# Patient Record
Sex: Female | Born: 1960 | Race: White | Hispanic: No | Marital: Single | Smoking: Never smoker
Health system: Southern US, Community
[De-identification: ages and names within clinical notes are randomized; demographics above are authoritative.]

## PROBLEM LIST (undated history)

## (undated) DIAGNOSIS — E669 Obesity, unspecified: Secondary | ICD-10-CM

## (undated) DIAGNOSIS — Z9109 Other allergy status, other than to drugs and biological substances: Secondary | ICD-10-CM

## (undated) DIAGNOSIS — T7840XA Allergy, unspecified, initial encounter: Secondary | ICD-10-CM

## (undated) DIAGNOSIS — M2550 Pain in unspecified joint: Secondary | ICD-10-CM

## (undated) DIAGNOSIS — I10 Essential (primary) hypertension: Secondary | ICD-10-CM

## (undated) HISTORY — DX: Pain in unspecified joint: M25.50

## (undated) HISTORY — DX: Obesity, unspecified: E66.9

## (undated) HISTORY — DX: Allergy, unspecified, initial encounter: T78.40XA

## (undated) HISTORY — PX: TONSILLECTOMY: SUR1361

## (undated) HISTORY — DX: Essential (primary) hypertension: I10

## (undated) HISTORY — DX: Other allergy status, other than to drugs and biological substances: Z91.09

---

## 2001-02-28 ENCOUNTER — Other Ambulatory Visit: Admission: RE | Admit: 2001-02-28 | Discharge: 2001-02-28 | Payer: Self-pay | Admitting: Internal Medicine

## 2002-05-09 ENCOUNTER — Other Ambulatory Visit: Admission: RE | Admit: 2002-05-09 | Discharge: 2002-05-09 | Payer: Self-pay | Admitting: Obstetrics and Gynecology

## 2003-05-21 ENCOUNTER — Other Ambulatory Visit: Admission: RE | Admit: 2003-05-21 | Discharge: 2003-05-21 | Payer: Self-pay | Admitting: Obstetrics and Gynecology

## 2004-05-21 ENCOUNTER — Other Ambulatory Visit: Admission: RE | Admit: 2004-05-21 | Discharge: 2004-05-21 | Payer: Self-pay | Admitting: Obstetrics and Gynecology

## 2005-05-26 ENCOUNTER — Other Ambulatory Visit: Admission: RE | Admit: 2005-05-26 | Discharge: 2005-05-26 | Payer: Self-pay | Admitting: Obstetrics and Gynecology

## 2011-06-14 ENCOUNTER — Ambulatory Visit (INDEPENDENT_AMBULATORY_CARE_PROVIDER_SITE_OTHER): Payer: 59

## 2011-06-14 DIAGNOSIS — E162 Hypoglycemia, unspecified: Secondary | ICD-10-CM

## 2011-06-14 DIAGNOSIS — I10 Essential (primary) hypertension: Secondary | ICD-10-CM

## 2012-03-30 ENCOUNTER — Ambulatory Visit (INDEPENDENT_AMBULATORY_CARE_PROVIDER_SITE_OTHER): Payer: 59 | Admitting: Family Medicine

## 2012-03-30 ENCOUNTER — Encounter: Payer: Self-pay | Admitting: Family Medicine

## 2012-03-30 VITALS — BP 160/94 | HR 97 | Temp 98.2°F | Ht 66.0 in | Wt 272.8 lb

## 2012-03-30 DIAGNOSIS — I10 Essential (primary) hypertension: Secondary | ICD-10-CM

## 2012-03-30 DIAGNOSIS — E669 Obesity, unspecified: Secondary | ICD-10-CM

## 2012-03-30 DIAGNOSIS — N390 Urinary tract infection, site not specified: Secondary | ICD-10-CM

## 2012-03-30 DIAGNOSIS — Z9109 Other allergy status, other than to drugs and biological substances: Secondary | ICD-10-CM

## 2012-03-30 LAB — CBC WITH DIFFERENTIAL/PLATELET
Basophils Absolute: 0 10*3/uL (ref 0.0–0.1)
Eosinophils Absolute: 0.2 10*3/uL (ref 0.0–0.7)
Lymphocytes Relative: 21.4 % (ref 12.0–46.0)
MCHC: 32.6 g/dL (ref 30.0–36.0)
MCV: 86.3 fl (ref 78.0–100.0)
Monocytes Absolute: 0.6 10*3/uL (ref 0.1–1.0)
Neutro Abs: 4.6 10*3/uL (ref 1.4–7.7)
Neutrophils Relative %: 65.7 % (ref 43.0–77.0)
RDW: 13.1 % (ref 11.5–14.6)

## 2012-03-30 LAB — POCT URINALYSIS DIPSTICK
Bilirubin, UA: NEGATIVE
Blood, UA: NEGATIVE
Ketones, UA: NEGATIVE
pH, UA: 6.5

## 2012-03-30 LAB — BASIC METABOLIC PANEL
CO2: 24 mEq/L (ref 19–32)
Calcium: 8.6 mg/dL (ref 8.4–10.5)
Chloride: 107 mEq/L (ref 96–112)
Creatinine, Ser: 0.9 mg/dL (ref 0.4–1.2)
Glucose, Bld: 98 mg/dL (ref 70–99)

## 2012-03-30 LAB — LIPID PANEL
HDL: 51.4 mg/dL (ref >39.00)
Triglycerides: 88 mg/dL (ref 0.0–149.0)
VLDL: 17.6 mg/dL (ref 0.0–40.0)

## 2012-03-30 LAB — HEPATIC FUNCTION PANEL
Albumin: 3.5 g/dL (ref 3.5–5.2)
Alkaline Phosphatase: 82 U/L (ref 39–117)
Bilirubin, Direct: 0.1 mg/dL (ref 0.0–0.3)
Total Protein: 7.1 g/dL (ref 6.0–8.3)

## 2012-03-30 MED ORDER — LOSARTAN POTASSIUM-HCTZ 50-12.5 MG PO TABS: 1.0000 | ORAL_TABLET | Freq: Every day | ORAL | Status: DC

## 2012-03-30 NOTE — Patient Instructions (Addendum)
Hypertension As your heart beats, it forces blood through your arteries. This force is your blood pressure. If the pressure is too high, it is called hypertension (HTN) or high blood pressure. HTN is dangerous because you may have it and not know it. High blood pressure may mean that your heart has to work harder to pump blood. Your arteries may be narrow or stiff. The extra work puts you at risk for heart disease, stroke, and other problems.  Blood pressure consists of two numbers, a higher number over a lower, 110/72, for example. It is stated as "110 over 72." The ideal is below 120 for the top number (systolic) and under 80 for the bottom (diastolic). Write down your blood pressure today. You should pay close attention to your blood pressure if you have certain conditions such as:  Heart failure.  Prior heart attack.  Diabetes  Chronic kidney disease.  Prior stroke.  Multiple risk factors for heart disease. To see if you have HTN, your blood pressure should be measured while you are seated with your arm held at the level of the heart. It should be measured at least twice. A one-time elevated blood pressure reading (especially in the Emergency Department) does not mean that you need treatment. There may be conditions in which the blood pressure is different between your right and left arms. It is important to see your caregiver soon for a recheck. Most people have essential hypertension which means that there is not a specific cause. This type of high blood pressure may be lowered by changing lifestyle factors such as:  Stress.  Smoking.  Lack of exercise.  Excessive weight.  Drug/tobacco/alcohol use.  Eating less salt. Most people do not have symptoms from high blood pressure until it has caused damage to the body. Effective treatment can often prevent, delay or reduce that damage. TREATMENT  When a cause has been identified, treatment for high blood pressure is directed at the  cause. There are a large number of medications to treat HTN. These fall into several categories, and your caregiver will help you select the medicines that are best for you. Medications may have side effects. You should review side effects with your caregiver. If your blood pressure stays high after you have made lifestyle changes or started on medicines,   Your medication(s) may need to be changed.  Other problems may need to be addressed.  Be certain you understand your prescriptions, and know how and when to take your medicine.  Be sure to follow up with your caregiver within the time frame advised (usually within two weeks) to have your blood pressure rechecked and to review your medications.  If you are taking more than one medicine to lower your blood pressure, make sure you know how and at what times they should be taken. Taking two medicines at the same time can result in blood pressure that is too low. SEEK IMMEDIATE MEDICAL CARE IF:  You develop a severe headache, blurred or changing vision, or confusion.  You have unusual weakness or numbness, or a faint feeling.  You have severe chest or abdominal pain, vomiting, or breathing problems. MAKE SURE YOU:   Understand these instructions.  Will watch your condition.  Will get help right away if you are not doing well or get worse. Document Released: 06/08/2005 Document Revised: 08/31/2011 Document Reviewed: 01/27/2008 [facility] Patient Information 2013 [place], [place].  Calorie Counting Diet A calorie counting diet requires you to eat the number of calories  that are right for you in a day. Calories are the measurement of how much energy you get from the food you eat. Eating the right amount of calories is important for staying at a healthy weight. If you eat too many calories, your body will store them as fat and you may gain weight. If you eat too few calories, you may lose weight. Counting the number of calories you eat during a  day will help you know if you are eating the right amount. A Registered Dietitian can determine how many calories you need in a day. The amount of calories needed varies from person to person. If your goal is to lose weight, you will need to eat fewer calories. Losing weight can benefit you if you are overweight or have health problems such as heart disease, high blood pressure, or diabetes. If your goal is to gain weight, you will need to eat more calories. Gaining weight may be necessary if you have a certain health problem that causes your body to need more energy. TIPS Whether you are increasing or decreasing the number of calories you eat during a day, it may be hard to get used to changes in what you eat and drink. The following are tips to help you keep track of the number of calories you eat.  Measure foods at home with measuring cups. This helps you know the amount of food and number of calories you are eating.  Restaurants often serve food in amounts that are larger than 1 serving. While eating out, estimate how many servings of a food you are given. For example, a serving of cooked rice is  cup or about the size of half of a fist. Knowing serving sizes will help you be aware of how much food you are eating at restaurants.  Ask for smaller portion sizes or child-size portions at restaurants.  Plan to eat half of a meal at a restaurant. Take the rest home or share the other half with a friend.  Read the Nutrition Facts panel on food labels for calorie content and serving size. You can find out how many servings are in a package, the size of a serving, and the number of calories each serving has.  For example, a package might contain 3 cookies. The Nutrition Facts panel on that package says that 1 serving is 1 cookie. Below that, it will say there are 3 servings in the container. The calories section of the Nutrition Facts label says there are 90 calories. This means there are 90 calories in  1 cookie (1 serving). If you eat 1 cookie you have eaten 90 calories. If you eat all 3 cookies, you have eaten 270 calories (3 servings x 90 calories = 270 calories). The list below tells you how big or small some common portion sizes are.  1 oz.........4 stacked dice.  3 oz........Marland KitchenDeck of cards.  1 tsp.......Marland KitchenTip of little finger.  1 tbs......Marland KitchenMarland KitchenThumb.  2 tbs.......Marland KitchenGolf ball.   cup......Marland KitchenHalf of a fist.  1 cup.......Marland KitchenA fist. KEEP A FOOD LOG Write down every food item you eat, the amount you eat, and the number of calories in each food you eat during the day. At the end of the day, you can add up the total number of calories you have eaten. It may help to keep a list like the one below. Find out the calorie information by reading the Nutrition Facts panel on food labels. Breakfast  Bran cereal (1 cup, 110 calories).  Fat-free milk ( cup, 45 calories). Snack  Apple (1 medium, 80 calories). Lunch  Spinach (1 cup, 20 calories).  Tomato ( medium, 20 calories).  Chicken breast strips (3 oz, 165 calories).  Shredded cheddar cheese ( cup, 110 calories).  Light [place] dressing (2 tbs, 60 calories).  Whole-wheat bread (1 slice, 80 calories).  Tub margarine (1 tsp, 35 calories).  Vegetable soup (1 cup, 160 calories). Dinner  Pork chop (3 oz, 190 calories).  Brown rice (1 cup, 215 calories).  Steamed broccoli ( cup, 20 calories).  Strawberries (1  cup, 65 calories).  Whipped cream (1 tbs, 50 calories). Daily Calorie Total: 1425 Document Released: 06/08/2005 Document Revised: 08/31/2011 Document Reviewed: 12/03/2006 [facility] Patient Information 2013 [place], [place].

## 2012-03-30 NOTE — Addendum Note (Signed)
Addended by: Silvio Pate D on: 03/30/2012 02:26 PM   Modules accepted: Orders

## 2012-03-30 NOTE — Progress Notes (Signed)
  Subjective:    Patient here for follow-up of elevated blood pressure.  She is not exercising and is not adherent to a low-salt diet.  Blood pressure is not checked  home. Cardiac symptoms: none. Patient denies: chest pain, chest pressure/discomfort, claudication, dyspnea, exertional chest pressure/discomfort, fatigue, irregular heart beat, lower extremity edema, near-syncope, orthopnea, palpitations, paroxysmal nocturnal dyspnea, syncope and tachypnea. Cardiovascular risk factors: hypertension, obesity (BMI >= 30 kg/m2) and sedentary lifestyle. Use of agents associated with hypertension: none. History of target organ damage: none.  The following portions of the patient's history were reviewed and updated as appropriate: allergies, current medications, past family history, past medical history, past social history, past surgical history and problem list.  Review of Systems Pertinent items are noted in HPI.     Objective:    BP 160/94  Pulse 97  Temp 98.2 F (36.8 C) (Oral)  Ht 5\' 6"  (1.676 m)  Wt 272 lb 12.8 oz (123.741 kg)  BMI 44.03 kg/m2  SpO2 95% General appearance: alert, cooperative, appears stated age and no distress Neck: no adenopathy, no carotid bruit, no JVD, supple, symmetrical, trachea midline and thyroid not enlarged, symmetric, no tenderness/mass/nodules Lungs: clear to auscultation bilaterally Heart: regular rate and rhythm, S1, S2 normal, no murmur, click, rub or gallop Extremities: extremities normal, atraumatic, no cyanosis or edema    Assessment:    Hypertension, stage 1 . Evidence of target organ damage: none.   obesity---d/w pt diet and exercise Plan:    Medication: no change and pt had not been on meds for a while because she ran out. Dietary sodium restriction. Regular aerobic exercise. Check blood pressures 2-3 times weekly and record. Follow up: 2 weeks and as needed.

## 2012-03-31 ENCOUNTER — Telehealth: Payer: Self-pay

## 2012-03-31 LAB — T4, FREE: Free T4: 0.76 ng/dL (ref 0.60–1.60)

## 2012-03-31 NOTE — Telephone Encounter (Signed)
Pt calling for test results.  All results given, except urine culture, which is not yet back.

## 2012-04-01 ENCOUNTER — Telehealth: Payer: Self-pay | Admitting: Family Medicine

## 2012-04-01 NOTE — Telephone Encounter (Signed)
Mailed pt results per request.         MW

## 2012-04-01 NOTE — Telephone Encounter (Signed)
Pt wants labe results mailed to her she doesn't have computer, pls mail

## 2012-04-02 LAB — URINE CULTURE: Colony Count: >=100000

## 2012-04-15 ENCOUNTER — Encounter: Payer: Self-pay | Admitting: Family Medicine

## 2012-04-15 ENCOUNTER — Ambulatory Visit (INDEPENDENT_AMBULATORY_CARE_PROVIDER_SITE_OTHER): Payer: 59 | Admitting: Family Medicine

## 2012-04-15 VITALS — BP 142/88 | HR 85 | Temp 98.6°F | Wt 275.0 lb

## 2012-04-15 DIAGNOSIS — I10 Essential (primary) hypertension: Secondary | ICD-10-CM

## 2012-04-15 MED ORDER — LOSARTAN POTASSIUM-HCTZ 100-25 MG PO TABS: 1.0000 | ORAL_TABLET | Freq: Every day | ORAL | Status: DC

## 2012-04-15 NOTE — Patient Instructions (Signed)

## 2012-04-15 NOTE — Progress Notes (Signed)
  Subjective:    Patient here for follow-up of elevated blood pressure.  She is not exercising and is adherent to a low-salt diet.  Blood pressure is not checked at home. Cardiac symptoms: none. Patient denies: chest pain, chest pressure/discomfort, claudication, dyspnea, exertional chest pressure/discomfort, fatigue, irregular heart beat, lower extremity edema, near-syncope, orthopnea, palpitations, paroxysmal nocturnal dyspnea, syncope and tachypnea. Cardiovascular risk factors: hypertension, obesity (BMI >= 30 kg/m2) and sedentary lifestyle. Use of agents associated with hypertension: none. History of target organ damage: none.  The following portions of the patient's history were reviewed and updated as appropriate: allergies, current medications, past family history, past medical history, past social history, past surgical history and problem list.  Review of Systems Pertinent items are noted in HPI.     Objective:    BP 142/88  Pulse 85  Temp 98.6 F (37 C) (Oral)  Wt 275 lb (124.739 kg)  SpO2 96% General appearance: alert, cooperative, appears stated age and no distress Lungs: clear to auscultation bilaterally Heart: S1, S2 normal Extremities: extremities normal, atraumatic, no cyanosis or edema    Assessment:    Hypertension, stage 1 . Evidence of target organ damage: none.    Plan:    Medication: increase to hyzaar 100/25 daily. Dietary sodium restriction. Regular aerobic exercise. Check blood pressures 2-3 times weekly and record. Follow up: 3 weeks and as needed.

## 2012-05-06 ENCOUNTER — Ambulatory Visit: Payer: 59 | Admitting: Family Medicine

## 2012-05-13 ENCOUNTER — Ambulatory Visit (INDEPENDENT_AMBULATORY_CARE_PROVIDER_SITE_OTHER): Payer: 59 | Admitting: Family Medicine

## 2012-05-13 ENCOUNTER — Encounter: Payer: Self-pay | Admitting: Family Medicine

## 2012-05-13 VITALS — BP 142/84 | HR 80 | Temp 98.2°F | Wt 270.0 lb

## 2012-05-13 DIAGNOSIS — I10 Essential (primary) hypertension: Secondary | ICD-10-CM

## 2012-05-13 MED ORDER — LOSARTAN POTASSIUM 100 MG PO TABS: 100.0000 mg | ORAL_TABLET | Freq: Every day | ORAL | Status: DC

## 2012-05-13 NOTE — Progress Notes (Signed)
  Subjective:    Patient here for follow-up of elevated blood pressure.  She is not exercising and is adherent to a low-salt diet.  Blood pressure not checked  at home. Cardiac symptoms: none. Patient denies: chest pain, chest pressure/discomfort, claudication, dyspnea, exertional chest pressure/discomfort, fatigue, irregular heart beat, lower extremity edema, near-syncope, orthopnea, palpitations, paroxysmal nocturnal dyspnea, syncope and tachypnea. Cardiovascular risk factors: hypertension, obesity (BMI >= 30 kg/m2) and sedentary lifestyle. Use of agents associated with hypertension: none. History of target organ damage: none.  The following portions of the patient's history were reviewed and updated as appropriate: allergies, current medications, past family history, past medical history, past social history, past surgical history and problem list.  Review of Systems Pertinent items are noted in HPI.     Objective:    BP 142/84  Pulse 80  Temp 98.2 F (36.8 C) (Oral)  Wt 270 lb (122.471 kg)  SpO2 97% General appearance: alert, cooperative, appears stated age and no distress Lungs: clear to auscultation bilaterally Heart: S1, S2 normal Extremities: extremities normal, atraumatic, no cyanosis or edema    Assessment:    Hypertension, stage 1 . Evidence of target organ damage: none.    Plan:    Medication: stop hyzaar and change to cozaar 100 mg. Dietary sodium restriction. Regular aerobic exercise. Check blood pressures 2-3 times weekly and record. Follow up: 2 weeks and as needed.

## 2012-05-13 NOTE — Patient Instructions (Signed)

## 2012-06-02 ENCOUNTER — Ambulatory Visit: Payer: 59 | Admitting: Family Medicine

## 2012-06-06 ENCOUNTER — Encounter: Payer: Self-pay | Admitting: Family Medicine

## 2012-06-06 ENCOUNTER — Ambulatory Visit (INDEPENDENT_AMBULATORY_CARE_PROVIDER_SITE_OTHER): Payer: 59 | Admitting: Family Medicine

## 2012-06-06 VITALS — BP 136/84 | HR 97 | Temp 97.6°F | Wt 269.4 lb

## 2012-06-06 DIAGNOSIS — I10 Essential (primary) hypertension: Secondary | ICD-10-CM

## 2012-06-06 MED ORDER — LOSARTAN POTASSIUM 100 MG PO TABS: ORAL_TABLET | ORAL | Status: DC

## 2012-06-06 NOTE — Patient Instructions (Addendum)

## 2012-06-06 NOTE — Progress Notes (Signed)
  Subjective:    Patient here for follow-up of elevated blood pressure.  She is not exercising and is adherent to a low-salt diet.  Blood pressure is well controlled at home. Cardiac symptoms: none. Patient denies: chest pain, chest pressure/discomfort, claudication, dyspnea, exertional chest pressure/discomfort, fatigue, irregular heart beat, lower extremity edema, near-syncope, orthopnea, palpitations, paroxysmal nocturnal dyspnea, syncope and tachypnea. Cardiovascular risk factors: hypertension, obesity (BMI >= 30 kg/m2) and sedentary lifestyle. Use of agents associated with hypertension: none. History of target organ damage: none.  The following portions of the patient's history were reviewed and updated as appropriate: allergies, current medications, past family history, past medical history, past social history, past surgical history and problem list.  Review of Systems Pertinent items are noted in HPI.     Objective:    BP 136/84  Pulse 97  Temp 97.6 F (36.4 C) (Oral)  Wt 269 lb 6.4 oz (122.199 kg)  SpO2 97% General appearance: alert, cooperative, appears stated age and no distress Neck: no adenopathy, supple, symmetrical, trachea midline and thyroid not enlarged, symmetric, no tenderness/mass/nodules Lungs: clear to auscultation bilaterally Heart: S1, S2 normal Extremities: extremities normal, atraumatic, no cyanosis or edema    Assessment:    Hypertension, normal blood pressure . Evidence of target organ damage: none.    Plan:    Medication: no change. Dietary sodium restriction. Regular aerobic exercise. Check blood pressures 2-3 times weekly and record. Follow up: 6 months and as needed.

## 2012-12-05 ENCOUNTER — Ambulatory Visit (INDEPENDENT_AMBULATORY_CARE_PROVIDER_SITE_OTHER): Payer: 59 | Admitting: Family Medicine

## 2012-12-05 ENCOUNTER — Encounter: Payer: Self-pay | Admitting: Family Medicine

## 2012-12-05 VITALS — BP 124/76 | HR 84 | Temp 99.5°F | Wt 271.0 lb

## 2012-12-05 DIAGNOSIS — J309 Allergic rhinitis, unspecified: Secondary | ICD-10-CM

## 2012-12-05 DIAGNOSIS — J209 Acute bronchitis, unspecified: Secondary | ICD-10-CM

## 2012-12-05 MED ORDER — HYDROCODONE-HOMATROPINE 5-1.5 MG/5ML PO SYRP: ORAL_SOLUTION | ORAL | Status: DC

## 2012-12-05 MED ORDER — AZITHROMYCIN 250 MG PO TABS: ORAL_TABLET | ORAL | Status: DC

## 2012-12-05 MED ORDER — MOMETASONE FUROATE 50 MCG/ACT NA SUSP: 2.0000 | Freq: Every day | NASAL | Status: DC

## 2012-12-05 NOTE — Progress Notes (Signed)
  Subjective:     Kirsten Nelson is a 52 y.o. female here for evaluation of a cough. Onset of symptoms was 4 days ago. Symptoms have been gradually worsening since that time. The cough is productive of green sputum and is aggravated by infection, pollens and reclining position. Associated symptoms include: change in voice, postnasal drip, shortness of breath and sputum production. Patient does not have a history of asthma. Patient does have a history of environmental allergens. Patient has not traveled recently. Patient does not have a history of smoking. Patient has not had a previous chest x-ray. Patient has not had a PPD done.  The following portions of the patient's history were reviewed and updated as appropriate: allergies, current medications, past family history, past medical history, past social history, past surgical history and problem list.  Review of Systems Pertinent items are noted in HPI.    Objective:    Oxygen saturation 96% on room air BP 124/76  Pulse 84  Temp(Src) 99.5 F (37.5 C) (Oral)  Wt 271 lb (122.925 kg)  BMI 43.76 kg/m2  SpO2 96% General appearance: alert, cooperative, appears stated age and no distress Ears: normal TM's and external ear canals both ears Nose: green discharge, mild congestion, no sinus tenderness Throat: lips, mucosa, and tongue normal; teeth and gums normal Neck: moderate anterior cervical adenopathy, supple, symmetrical, trachea midline and thyroid not enlarged, symmetric, no tenderness/mass/nodules Lungs: rhonchi bilaterally Heart: S1, S2 normal    Assessment:    Acute Bronchitis    Plan:    Antibiotics per medication orders. Antitussives per medication orders. Avoid exposure to tobacco smoke and fumes. Call if shortness of breath worsens, blood in sputum, change in character of cough, development of fever or chills, inability to maintain nutrition and hydration. Avoid exposure to tobacco smoke and fumes. Trial of  antihistamines. Trial of steroid nasal spray.

## 2012-12-05 NOTE — Patient Instructions (Signed)

## 2012-12-07 ENCOUNTER — Telehealth: Payer: Self-pay | Admitting: Family Medicine

## 2012-12-07 NOTE — Telephone Encounter (Signed)
Patient is calling back about note. Wants to know if she can receive a call back today.

## 2012-12-07 NOTE — Telephone Encounter (Signed)
Patient aware note ready for pick up.      KP 

## 2012-12-07 NOTE — Telephone Encounter (Signed)
Patient called requesting that we write her a new work note and change the return to work date from 6/18 to 6/23. She states she does not think she can go back to work right now. Call [phone]

## 2012-12-07 NOTE — Telephone Encounter (Signed)
Ok to give note 

## 2013-02-27 ENCOUNTER — Telehealth: Payer: Self-pay | Admitting: General Practice

## 2013-02-27 MED ORDER — AMLODIPINE BESYLATE 5 MG PO TABS: 5.0000 mg | ORAL_TABLET | Freq: Every day | ORAL | Status: DC

## 2013-02-27 NOTE — Telephone Encounter (Signed)
Called both numbers listed. LMOVM for pt to return call on both.

## 2013-02-27 NOTE — Telephone Encounter (Signed)
Spoke with pt and notified her of the med change. OV made on 9/22 for follow up.

## 2013-02-27 NOTE — Telephone Encounter (Signed)
Cozaar can cause cough.  It is less likely than ace inhibitors but can.   We can change to norvasc 5 mg 1 po qd #30  ---ov in 2 weeks

## 2013-02-27 NOTE — Telephone Encounter (Signed)
Patient states that she has a dry cough that has not went away that she believed is cause by her BP medication. She does not recall how long.  She asked her pharmacist if anyone else has mentioned the BP medication causing this and was told that according to studies 3-11% of people have this symptom. Patient says besides the cough she has not had any issues with her BP medication.

## 2013-02-27 NOTE — Telephone Encounter (Signed)
Please advise if you feel that the Bp meds could be causing the cough? I can schedule pt for an appt.

## 2013-02-27 NOTE — Telephone Encounter (Signed)
Message on triage line: Pt states she is having an issue with her BP meds. Called pt back and LMOVM to verify what type of problems she is having? And for how long?

## 2013-03-13 ENCOUNTER — Ambulatory Visit (INDEPENDENT_AMBULATORY_CARE_PROVIDER_SITE_OTHER): Payer: 59 | Admitting: Family Medicine

## 2013-03-13 ENCOUNTER — Encounter: Payer: Self-pay | Admitting: Family Medicine

## 2013-03-13 VITALS — BP 142/84 | HR 107 | Temp 98.1°F | Wt 273.0 lb

## 2013-03-13 DIAGNOSIS — I10 Essential (primary) hypertension: Secondary | ICD-10-CM

## 2013-03-13 MED ORDER — AMLODIPINE BESYLATE 10 MG PO TABS: 10.0000 mg | ORAL_TABLET | Freq: Every day | ORAL | Status: DC

## 2013-03-13 NOTE — Patient Instructions (Signed)

## 2013-03-13 NOTE — Progress Notes (Signed)
  Subjective:    Patient here for follow-up of elevated blood pressure.  She is not exercising and is adherent to a low-salt diet.  Blood pressure is not well controlled at home. Cardiac symptoms: fatigue. Patient denies: chest pain, chest pressure/discomfort, claudication, dyspnea, exertional chest pressure/discomfort, irregular heart beat, lower extremity edema, near-syncope, orthopnea, palpitations, paroxysmal nocturnal dyspnea, syncope and tachypnea. Cardiovascular risk factors: hypertension, obesity (BMI >= 30 kg/m2) and sedentary lifestyle. Use of agents associated with hypertension: none. History of target organ damage: none.  The following portions of the patient's history were reviewed and updated as appropriate:  She  has a past medical history of HTN (hypertension) and Environmental allergies. She  does not have any pertinent problems on file. She  has past surgical history that includes Tonsillectomy. Her family history includes Hypertension in her father and mother; Leukemia in her father; Lymphoma in her father. She  reports that she has never smoked. She has never used smokeless tobacco. She reports that she does not drink alcohol or use illicit drugs. She has a current medication list which includes the following prescription(s): amlodipine. Current Outpatient Prescriptions on File Prior to Visit  Medication Sig Dispense Refill  . amLODipine (NORVASC) 5 MG tablet Take 1 tablet (5 mg total) by mouth daily.  30 tablet  0   No current facility-administered medications on file prior to visit.   She is allergic to ace inhibitors..  Review of Systems Pertinent items are noted in HPI.     Objective:    BP 142/84  Pulse 107  Temp(Src) 98.1 F (36.7 C) (Oral)  Wt 273 lb (123.832 kg)  BMI 44.08 kg/m2  SpO2 97% General appearance: alert, cooperative, appears stated age and no distress Neck: no adenopathy, no carotid bruit, no JVD, supple, symmetrical, trachea midline and thyroid  not enlarged, symmetric, no tenderness/mass/nodules Lungs: clear to auscultation bilaterally Heart: S1, S2 normal Extremities: extremities normal, atraumatic, no cyanosis or edema    Assessment:    Hypertension, stage 1 . Evidence of target organ damage: none.    Plan:    Medication: increase to norvasc 10 mg. Dietary sodium restriction. Regular aerobic exercise. Check blood pressures 2-3 times weekly and record. Follow up: 3 months and as needed.

## 2013-03-21 ENCOUNTER — Telehealth: Payer: Self-pay | Admitting: Family Medicine

## 2013-03-21 NOTE — Telephone Encounter (Signed)
Patient called and spoke with Mrs Kirsten Nelson up front and stated that her BP med's were doubled by dr Laury Axon causing her problems now. Patient wanted to know can she get them switched again. Thanks

## 2013-03-21 NOTE — Telephone Encounter (Signed)
If its swelling we may need to switch all together----

## 2013-03-21 NOTE — Telephone Encounter (Signed)
Norvasc was increase from 5 mg to 10 mg on 03/13/13. I have tried to call the patient to get details. MSG left to call on VM.     KP

## 2013-03-21 NOTE — Telephone Encounter (Signed)
Need more info

## 2013-03-22 ENCOUNTER — Telehealth: Payer: Self-pay | Admitting: Family Medicine

## 2013-03-22 MED ORDER — METOPROLOL SUCCINATE ER 50 MG PO TB24: 50.0000 mg | ORAL_TABLET | Freq: Every day | ORAL | Status: DC

## 2013-03-22 NOTE — Telephone Encounter (Signed)
Please advise      KP 

## 2013-03-22 NOTE — Telephone Encounter (Signed)
Discussed with patient and she voiced understanding. Rx sent, she will call back to schedule the follow up.     KP

## 2013-03-22 NOTE — Telephone Encounter (Signed)
Patient called to return Kirsten Nelson's phone call and stated that her BP medicine is causing swelling and stomach pain.

## 2013-03-22 NOTE — Telephone Encounter (Signed)
D/c norvasc and change to toprol xl 50 mg 1 po qd  #30  ---- f/u 2-3 weeks

## 2013-06-30 ENCOUNTER — Encounter: Payer: Self-pay | Admitting: Internal Medicine

## 2013-06-30 ENCOUNTER — Ambulatory Visit (INDEPENDENT_AMBULATORY_CARE_PROVIDER_SITE_OTHER): Payer: 59 | Admitting: Internal Medicine

## 2013-06-30 VITALS — BP 165/92 | HR 110 | Temp 99.1°F | Wt 274.0 lb

## 2013-06-30 DIAGNOSIS — J209 Acute bronchitis, unspecified: Secondary | ICD-10-CM

## 2013-06-30 DIAGNOSIS — J069 Acute upper respiratory infection, unspecified: Secondary | ICD-10-CM

## 2013-06-30 DIAGNOSIS — I10 Essential (primary) hypertension: Secondary | ICD-10-CM

## 2013-06-30 MED ORDER — HYDROCODONE-HOMATROPINE 5-1.5 MG/5ML PO SYRP: ORAL_SOLUTION | ORAL | Status: DC

## 2013-06-30 MED ORDER — AMOXICILLIN 500 MG PO CAPS: 1000.0000 mg | ORAL_CAPSULE | Freq: Two times a day (BID) | ORAL | Status: DC

## 2013-06-30 NOTE — Progress Notes (Signed)
   Subjective:    Patient ID: Kirsten Nelson, female    DOB: [birth year], 53 y.o.   MRN: [id number]  HPI Acute visit Symptoms started ~ 8 days ago with sore throat, runny nose; this week she developed a persistent cough which is quite bothersome (has  bladder issues). She also developed nasal congestion with green nasal discharge. Taking Coricidin BP OTC and hydrocodone which was previously prescribed for nocturnal cough. BP was noted to be elevated, has not check it  lately. Good compliance with medication.  Past Medical History  Diagnosis Date  . HTN (hypertension)   . Environmental allergies    Past Surgical History  Procedure Laterality Date  . Tonsillectomy      Review of Systems No fever chills No myalgias or arthralgias. No nausea or diarrhea but vomited one time due to severe cough     Objective:   Physical Exam BP 165/92  Pulse 110  Temp(Src) 99.1 F (37.3 C)  Wt 274 lb (124.286 kg)  SpO2 93% General -- alert, well-developed, NAD.  HEENT-- Not pale. TMs normal, throat symmetric, no redness or discharge. Face symmetric, sinuses not tender to palpation. Nose slt congested. Lungs -- normal respiratory effort, no intercostal retractions, no accessory muscle use, and normal breath sounds.  Heart-- normal rate, regular rhythm, no murmur.   Neurologic--  alert & oriented X3. Speech normal, gait normal, strength normal in all extremities.  Psych-- Cognition and judgment appear intact. Cooperative with normal attention span and concentration. No anxious or depressed appearing.     Assessment & Plan:  URI-- see instructions HTN--BP elevated today, will ask patient to monitor her BP and notify PCP if not better. See instructions

## 2013-06-30 NOTE — Progress Notes (Signed)
Pre visit review using our clinic review tool, if applicable. No additional management support is needed unless otherwise documented below in the visit note. 

## 2013-06-30 NOTE — Patient Instructions (Signed)
Rest, fluids , tylenol For cough, take Mucinex DM twice a day as needed  For congestion use OTC Nasocort: 2 nasal sprays on each side of the nose daily until you feel better For nocturnal cough hydrocodone as needed, will cause somnolence Take the antibiotic as prescribed  (Amoxicillin) only if no better in 3-4 days Call if no better in 10 days Call anytime if the symptoms are severe  Check the  blood pressure 2  times a   week be sure it is between 110/60 and 140/85. Ideal blood pressure is 120/80. If it is consistently higher or lower, let Dr Catha Nottingham know

## 2013-07-02 ENCOUNTER — Encounter: Payer: Self-pay | Admitting: Internal Medicine

## 2013-08-04 ENCOUNTER — Encounter: Payer: Self-pay | Admitting: Family Medicine

## 2013-08-04 ENCOUNTER — Encounter: Payer: Self-pay | Admitting: Gastroenterology

## 2013-08-04 ENCOUNTER — Ambulatory Visit (INDEPENDENT_AMBULATORY_CARE_PROVIDER_SITE_OTHER): Payer: 59 | Admitting: Family Medicine

## 2013-08-04 VITALS — BP 120/70 | HR 74 | Temp 97.6°F | Wt 276.0 lb

## 2013-08-04 DIAGNOSIS — Z0000 Encounter for general adult medical examination without abnormal findings: Secondary | ICD-10-CM

## 2013-08-04 DIAGNOSIS — I10 Essential (primary) hypertension: Secondary | ICD-10-CM

## 2013-08-04 MED ORDER — METOPROLOL SUCCINATE ER 50 MG PO TB24: 50.0000 mg | ORAL_TABLET | Freq: Every day | ORAL | Status: DC

## 2013-08-04 NOTE — Progress Notes (Signed)
  Subjective:    Patient here for follow-up of elevated blood pressure.  She is not exercising and is adherent to a low-salt diet.  Blood pressure is well controlled at home. Cardiac symptoms: none. Patient denies: chest pain, chest pressure/discomfort, claudication, dyspnea, exertional chest pressure/discomfort, fatigue, irregular heart beat, lower extremity edema, near-syncope, orthopnea, palpitations, paroxysmal nocturnal dyspnea, syncope and tachypnea. Cardiovascular risk factors: hypertension, obesity (BMI >= 30 kg/m2) and sedentary lifestyle. Use of agents associated with hypertension: none. History of target organ damage: none.  The following portions of the patient's history were reviewed and updated as appropriate: allergies, current medications, past family history, past medical history, past social history, past surgical history and problem list.  Review of Systems Pertinent items are noted in HPI.     Objective:    BP 120/70  Pulse 74  Temp(Src) 97.6 F (36.4 C) (Oral)  Wt 276 lb (125.193 kg)  SpO2 95% General appearance: alert, cooperative, appears stated age and no distress Neck: no adenopathy, supple, symmetrical, trachea midline and thyroid not enlarged, symmetric, no tenderness/mass/nodules Lungs: clear to auscultation bilaterally Heart: S1, S2 normal Extremities: extremities normal, atraumatic, no cyanosis or edema    Assessment:    Hypertension, normal blood pressure . Evidence of target organ damage: none.    Plan:    Medication: no change. Dietary sodium restriction. Regular aerobic exercise. Follow up: 6 months and as needed.

## 2013-08-04 NOTE — Patient Instructions (Signed)

## 2013-08-04 NOTE — Progress Notes (Signed)
Pre visit review using our clinic review tool, if applicable. No additional management support is needed unless otherwise documented below in the visit note. 

## 2013-08-07 ENCOUNTER — Telehealth: Payer: Self-pay | Admitting: Family Medicine

## 2013-08-07 NOTE — Telephone Encounter (Signed)
Relevant patient education assigned to patient using Emmi. ° °

## 2013-08-14 ENCOUNTER — Encounter: Payer: Self-pay | Admitting: Gastroenterology

## 2013-09-13 ENCOUNTER — Encounter: Payer: 59 | Admitting: Gastroenterology

## 2013-09-25 ENCOUNTER — Ambulatory Visit (AMBULATORY_SURGERY_CENTER): Payer: Self-pay | Admitting: *Deleted

## 2013-09-25 VITALS — Ht 66.0 in | Wt 280.0 lb

## 2013-09-25 DIAGNOSIS — Z1211 Encounter for screening for malignant neoplasm of colon: Secondary | ICD-10-CM

## 2013-09-25 MED ORDER — MOVIPREP 100 G PO SOLR
ORAL | Status: DC
Start: 2013-09-25 — End: 2013-10-06

## 2013-09-25 NOTE — Progress Notes (Signed)
No egg or soy allergy  No home oxygen use or problems with anesthesia

## 2013-10-02 ENCOUNTER — Encounter: Payer: Self-pay | Admitting: Gastroenterology

## 2013-10-06 ENCOUNTER — Ambulatory Visit (AMBULATORY_SURGERY_CENTER): Payer: 59 | Admitting: Gastroenterology

## 2013-10-06 ENCOUNTER — Encounter: Payer: Self-pay | Admitting: Gastroenterology

## 2013-10-06 VITALS — BP 122/74 | HR 65 | Temp 98.1°F | Resp 22 | Ht 66.0 in | Wt 280.0 lb

## 2013-10-06 DIAGNOSIS — D126 Benign neoplasm of colon, unspecified: Secondary | ICD-10-CM

## 2013-10-06 DIAGNOSIS — Z1211 Encounter for screening for malignant neoplasm of colon: Secondary | ICD-10-CM

## 2013-10-06 MED ORDER — SODIUM CHLORIDE 0.9 % IV SOLN: 500.0000 mL | INTRAVENOUS | Status: DC

## 2013-10-06 NOTE — Progress Notes (Signed)
Procedure ends, to recovery, report given and VSS. 

## 2013-10-06 NOTE — Op Note (Signed)
[facility]  [address] [place], [address]   COLONOSCOPY PROCEDURE REPORT  PATIENT: Kirsten, Nelson  MR#: [id number] BIRTHDATE: 10-29-1960 , 82  yrs. old GENDER: Female ENDOSCOPIST: Milus Banister, MD REFERRED OZ:HYQMVH [place], DO PROCEDURE DATE:  10/06/2013 PROCEDURE:   Colonoscopy with snare polypectomy First Screening Colonoscopy - Avg.  risk and is 50 yrs.  old or older Yes.  Prior Negative Screening - Now for repeat screening. N/A  History of Adenoma - Now for follow-up colonoscopy & has been > or = to 3 yrs.  N/A  Polyps Removed Today? Yes. ASA CLASS:   Class II INDICATIONS:average risk screening. MEDICATIONS: MAC sedation, administered by CRNA and propofol (Diprivan) 250mg  IV  DESCRIPTION OF PROCEDURE:   After the risks benefits and alternatives of the procedure were thoroughly explained, informed consent was obtained.  A digital rectal exam revealed no abnormalities of the rectum.   The LB [id number]  endoscope was introduced through the anus and advanced to the cecum, which was identified by both the appendix and ileocecal valve. No adverse events experienced.   The quality of the prep was excellent.  The instrument was then slowly withdrawn as the colon was fully examined.   COLON FINDINGS: One polyp was found, removed and sent to pathology. This was sessile, located in cecum, 37mm across, removed with cold snare.  The examination was otherwise normal.  Retroflexed views revealed no abnormalities. The time to cecum=3 minutes 08 seconds. Withdrawal time=5 minutes 53 seconds.  The scope was withdrawn and the procedure completed. COMPLICATIONS: There were no complications.  ENDOSCOPIC IMPRESSION: One polyp was found, removed and sent to pathology. The examination was otherwise normal.  RECOMMENDATIONS: If the polyp(s) removed today are proven to be adenomatous (pre-cancerous) polyps, you will need a repeat colonoscopy in 5 years.   Otherwise you should continue to follow colorectal cancer screening guidelines for "routine risk" patients with colonoscopy in 10 years.  You will receive a letter within 1-2 weeks with the results of your biopsy as well as final recommendations.  Please call my office if you have not received a letter after 3 weeks.   eSigned:  Milus Banister, MD 10/06/2013 10:10 AM

## 2013-10-06 NOTE — Patient Instructions (Signed)

## 2013-10-06 NOTE — Progress Notes (Signed)
Called to room to assist during endoscopic procedure.  Patient ID and intended procedure confirmed with present staff. Received instructions for my participation in the procedure from the performing physician.  

## 2013-10-09 ENCOUNTER — Telehealth: Payer: Self-pay

## 2013-10-09 NOTE — Telephone Encounter (Signed)
  Follow up Call-  Call back number 10/06/2013  Post procedure Call Back phone  # [phone]  Permission to leave phone message Yes     Patient questions:  Do you have a fever, pain , or abdominal swelling? no Pain Score  0 *  Have you tolerated food without any problems? yes  Have you been able to return to your normal activities? yes  Do you have any questions about your discharge instructions: Diet   no Medications  no Follow up visit  no  Do you have questions or concerns about your Care? no  Actions: * If pain score is 4 or above: No action needed, pain <4.  No problems per the pt. Maw

## 2013-10-11 ENCOUNTER — Encounter: Payer: Self-pay | Admitting: Gastroenterology

## 2014-07-10 ENCOUNTER — Ambulatory Visit (INDEPENDENT_AMBULATORY_CARE_PROVIDER_SITE_OTHER): Payer: 59 | Admitting: Internal Medicine

## 2014-07-10 ENCOUNTER — Encounter: Payer: Self-pay | Admitting: Internal Medicine

## 2014-07-10 VITALS — BP 140/90 | HR 65 | Temp 98.0°F | Ht 66.0 in | Wt 264.2 lb

## 2014-07-10 DIAGNOSIS — I8393 Asymptomatic varicose veins of bilateral lower extremities: Secondary | ICD-10-CM

## 2014-07-10 DIAGNOSIS — L309 Dermatitis, unspecified: Secondary | ICD-10-CM

## 2014-07-10 NOTE — Progress Notes (Signed)
   Subjective:    Patient ID: Kirsten Nelson, female    DOB: 1961/03/29, 54 y.o.   MRN: [id number]  HPI  Her exercise trainer noted a rash in the back of her legs which was asymptomatic 07/03/14  This was manifested as tiny bumps with a roughness of the skin. There was no associated itching until she began to use topical agents  She's been using anti-itch cream as well as hydrocortisone.   She bathes with a Tone liquid soap.  Review of Systems  No associated itchy, watery eyes.  Swelling of the lips or tongue denied.  Shortness of breath, wheezing, or cough absent.  Fever ,chills , or sweats denied. Purulence absent.  Diarrhea not present.    Objective:   Physical Exam Positive or pertinent findings include: She has severe varicose veins of the lower extremities, greater in the right lower extremity than the left. She has diffuse patchy eczematoid areas which are rough and faintly erythematous. There is no blanching to pressure There is no evidence of stasis dermatitis over the shins.  General appearance :adequately nourished; in no distress. As per CDC Guidelines ,Epic documents obesity as being present . Eyes: No conjunctival inflammation or scleral icterus is present. Oral exam: Dental hygiene is good. Lips and gums are healthy appearing.There is no oropharyngeal erythema or exudate noted.  Heart:  Normal rate and regular rhythm. S1 and S2 normal without gallop, murmur, click, rub or other extra sounds   Lungs:Chest clear to auscultation; no wheezes, rhonchi,rales ,or rubs present.No increased work of breathing.  Abdomen: bowel sounds normal, soft and non-tender without masses, organomegaly or hernias noted.  No guarding or rebound.  Vascular : all pulses equal ; no bruits present. Skin:Warm & dry. no jaundice or tenting Lymphatic: No lymphadenopathy is noted about the head, neck, axilla           Assessment & Plan:  #1 eczematoid dermatitis most likely related to  hot showers and ambient temperature/dry heat  Plan: See after visit summary Xyzal qhs for itching

## 2014-07-10 NOTE — Progress Notes (Signed)
Pre visit review using our clinic review tool, if applicable. No additional management support is needed unless otherwise documented below in the visit note. 

## 2014-07-10 NOTE — Patient Instructions (Addendum)
Use  Aveeno Daily  Moisturizing Lotion  twice a day  for the dry skin. Continue to bathe with moisturizing liquid soap , not bar soap.  Please call Dr Etter Sjogren if you would like to be referred to evaluate the varicose veins.

## 2014-07-11 DIAGNOSIS — I8390 Asymptomatic varicose veins of unspecified lower extremity: Secondary | ICD-10-CM | POA: Insufficient documentation

## 2014-07-22 ENCOUNTER — Ambulatory Visit (INDEPENDENT_AMBULATORY_CARE_PROVIDER_SITE_OTHER): Payer: 59 | Admitting: Internal Medicine

## 2014-07-22 VITALS — BP 151/98 | HR 74 | Temp 98.0°F | Resp 18 | Ht 66.0 in | Wt 266.0 lb

## 2014-07-22 DIAGNOSIS — L259 Unspecified contact dermatitis, unspecified cause: Secondary | ICD-10-CM

## 2014-07-22 DIAGNOSIS — T7840XA Allergy, unspecified, initial encounter: Secondary | ICD-10-CM

## 2014-07-22 MED ORDER — FLUOCINONIDE 0.05 % EX CREA: 1.0000 "application " | TOPICAL_CREAM | Freq: Two times a day (BID) | CUTANEOUS | Status: DC

## 2014-07-22 MED ORDER — PREDNISONE 20 MG PO TABS: ORAL_TABLET | ORAL | Status: DC

## 2014-07-22 NOTE — Progress Notes (Signed)
Subjective:  This chart was scribed for Kirsten Lin, MD by Dellis Filbert, ED Scribe at [facility].The patient was seen in exam room 07 and the patient's care was started at 11:01 AM.   Patient ID: Kirsten Nelson, female    DOB: 1960/12/22, 54 y.o.   MRN: [id number] Chief Complaint  Patient presents with  . Rash    ?allergic reaction to lotion   HPI HPI Comments: Kirsten Nelson is a 54 y.o. female with a history of hypertension who presents to [facility] complaining of an allergic reaction to a body lotion that she used yesterday morning. She has developed a rash which has progressively worsened since onset. The rash is located on her chest, arms and neck.  Pt has itching as associated symptoms. Pt took benadryl last night for little relief. Pt has not spent much time in the sun.  Patient Active Problem List   Diagnosis Date Noted  . 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 HTN (hypertension) 03/30/2012  . Environmental allergies 03/30/2012   Past Medical History  Diagnosis Date  . HTN (hypertension)   . Environmental allergies   . Allergy    Past Surgical History  Procedure Laterality Date  . Tonsillectomy     Allergies  Allergen Reactions  . Ace Inhibitors Cough   Prior to Admission medications   Medication Sig Start Date End Date Taking? Authorizing Provider  levocetirizine (XYZAL) 5 MG tablet Take 5 mg by mouth every evening.   Yes Historical Provider, MD  metoprolol succinate (TOPROL-XL) 50 MG 24 hr tablet Take 1 tablet (50 mg total) by mouth daily. Take with or immediately following a meal. 08/04/13  Yes Yvonne R Lowne, DO  mometasone (NASONEX) 50 MCG/ACT nasal spray Place 2 sprays into the nose as needed.   Yes Historical Provider, MD  Montelukast Sodium (SINGULAIR PO) Take by mouth daily.   Yes Historical Provider, MD   Review of Systems  Skin: Positive for color change and rash.       Objective:  BP  151/98 mmHg  Pulse 74  Temp(Src) 98 F (36.7 C) (Oral)  Resp 18  Ht 5\' 6"  (1.676 m)  Wt 266 lb (120.657 kg)  BMI 42.95 kg/m2  SpO2 94%  Physical Exam  Constitutional: She is oriented to person, place, and time. She appears well-developed and well-nourished. No distress.  HENT:  Head: Normocephalic and atraumatic.  Eyes: Conjunctivae are normal. Pupils are equal, round, and reactive to light.  Neck: Normal range of motion.  Cardiovascular: Normal rate and regular rhythm.   Pulmonary/Chest: Effort normal. No respiratory distress.  Musculoskeletal: Normal range of motion.  Neurological: She is alert and oriented to person, place, and time.  Skin: Skin is warm and dry.  Has slightly raised confluent erythematous rash over chest and arms with splotchy redness on the neck no vesicles no petechiae.   Psychiatric: She has a normal mood and affect. Her behavior is normal.  Nursing note and vitals reviewed.      Assessment & Plan:  Allergic reaction, initial encounter  Contact dermatitis  Meds ordered this encounter  Medications  . predniSONE (DELTASONE) 20 MG tablet    Sig: Take 3 tablets a day, 2 tablets tomorrow, and 1 on Tuesday.    Dispense:  6 tablet    Refill:  0  . fluocinonide cream (LIDEX) 0.05 %    Sig: Apply 1 application topically 2 (two) times daily.  Dispense:  30 g    Refill:  0  discontinue irritant Continue xyzal lubricant   I have completed the patient encounter in its entirety as documented by the scribe, with editing by me where necessary. Benjamin Casanas P. Laney Pastor, M.D.

## 2014-08-22 ENCOUNTER — Telehealth: Payer: Self-pay | Admitting: Family Medicine

## 2014-08-22 DIAGNOSIS — I10 Essential (primary) hypertension: Secondary | ICD-10-CM

## 2014-08-22 MED ORDER — METOPROLOL SUCCINATE ER 50 MG PO TB24: 50.0000 mg | ORAL_TABLET | Freq: Every day | ORAL | Status: DC

## 2014-08-22 NOTE — Telephone Encounter (Signed)
Rx faxed.    KP 

## 2014-08-22 NOTE — Telephone Encounter (Signed)
Caller name: Shannette, Tabares Relation to pt: self  Call back number: [phone] Pharmacy: [id number] - [place], [place] [phone] (Phone) [phone] (Fax)         Reason for call:  Pt requesting a refill metoprolol succinate (TOPROL-XL) 50 MG 24 hr tablet

## 2014-12-26 ENCOUNTER — Ambulatory Visit (HOSPITAL_BASED_OUTPATIENT_CLINIC_OR_DEPARTMENT_OTHER)
Admission: RE | Admit: 2014-12-26 | Discharge: 2014-12-26 | Disposition: A | Discharge status: Home / Self Care | Payer: Commercial Managed Care - HMO | Source: Ambulatory Visit | Attending: Physician Assistant | Admitting: Physician Assistant

## 2014-12-26 ENCOUNTER — Ambulatory Visit (INDEPENDENT_AMBULATORY_CARE_PROVIDER_SITE_OTHER): Payer: Commercial Managed Care - HMO | Admitting: Physician Assistant

## 2014-12-26 ENCOUNTER — Encounter: Payer: Self-pay | Admitting: Physician Assistant

## 2014-12-26 VITALS — BP 130/82 | HR 64 | Temp 98.3°F | Ht 66.0 in | Wt 276.4 lb

## 2014-12-26 DIAGNOSIS — M79662 Pain in left lower leg: Secondary | ICD-10-CM

## 2014-12-26 DIAGNOSIS — M79669 Pain in unspecified lower leg: Secondary | ICD-10-CM | POA: Insufficient documentation

## 2014-12-26 MED ORDER — MELOXICAM 7.5 MG PO TABS: 7.5000 mg | ORAL_TABLET | Freq: Every day | ORAL | Status: DC

## 2014-12-26 NOTE — Patient Instructions (Signed)
Please take Mobic daily as directed with food.  Alternate ice/heat to the area.  Salon Pas to the leg.  Limit stair climbing over the next week.  Go downstairs now for [name]. They will call me with result before letting you leave.

## 2014-12-26 NOTE — Progress Notes (Signed)
Pre visit review using our clinic review tool, if applicable. No additional management support is needed unless otherwise documented below in the visit note. 

## 2014-12-26 NOTE — Progress Notes (Signed)
Patient presents to clinic today c/o pain in left posterior calf after day of yard work and climbing multiple flights of stairs.  Denies swelling, bruising or numbness. Denies recent surgery, long distance travel or prolonged immobilization. Denies history of DVT or PE.  Has been using ICE and Aleve with mild relief of symptoms.  Past Medical History  Diagnosis Date  . HTN (hypertension)   . Environmental allergies   . Allergy     Current Outpatient Prescriptions on File Prior to Visit  Medication Sig Dispense Refill  . metoprolol succinate (TOPROL-XL) 50 MG 24 hr tablet Take 1 tablet (50 mg total) by mouth daily. Take with or immediately following a meal. 90 tablet 1  . Montelukast Sodium (SINGULAIR PO) Take by mouth daily.    Marland Kitchen levocetirizine (XYZAL) 5 MG tablet Take 5 mg by mouth every evening.     No current facility-administered medications on file prior to visit.    Allergies  Allergen Reactions  . Ace Inhibitors Cough    Family History  Problem Relation Age of Onset  . Hypertension Father   . Leukemia Father   . Lymphoma Father   . Cancer Father   . Hypertension Mother   . Colon cancer Neg Hx   . Esophageal cancer Neg Hx   . Rectal cancer Neg Hx   . Stomach cancer Neg Hx     History   Social History  . Marital Status: Single    Spouse Name: N/A  . Number of Children: N/A  . Years of Education: N/A   Occupational History  . collections Unemployed   Social History Main Topics  . Smoking status: Never Smoker   . Smokeless tobacco: Never Used  . Alcohol Use: No  . Drug Use: No  . Sexual Activity:    Partners: Male   Other Topics Concern  . None   Social History Narrative   Exercise-- no   Review of Systems - See HPI.  All other ROS are negative.  BP 130/82 mmHg  Pulse 64  Temp(Src) 98.3 F (36.8 C) (Oral)  Ht 5\' 6"  (1.676 m)  Wt 276 lb 6.4 oz (125.374 kg)  BMI 44.63 kg/m2  SpO2 94%  Physical Exam  Constitutional: She is oriented to  person, place, and time and well-developed, well-nourished, and in no distress.  HENT:  Head: Normocephalic and atraumatic.  Cardiovascular: Normal rate, regular rhythm, normal heart sounds and intact distal pulses.   Pulses:      Popliteal pulses are 2+ on the right side, and 2+ on the left side.       Dorsalis pedis pulses are 2+ on the right side, and 2+ on the left side.       Posterior tibial pulses are 2+ on the right side, and 2+ on the left side.  No edema/swelling noted of lower extremities  Pulmonary/Chest: Effort normal and breath sounds normal. No respiratory distress. She has no wheezes. She has no rales. She exhibits no tenderness.  Musculoskeletal:       Left lower leg: She exhibits tenderness. She exhibits no bony tenderness, no swelling, no edema and no deformity.  Negative Homan Sign  Neurological: She is alert and oriented to person, place, and time.  Skin: Skin is warm and dry. No rash noted.  Vitals reviewed.   No results found for this or any previous visit (from the past 2160 hour(s)).  Assessment/Plan: Calf pain STAT doppler US obtained and report reviewed -- negative for  DVT. Pain most likely MSK in nature giving activities the day before. Discussed RICE. Rx Mobic. Rest for the next couple of days. Call or return to clinic if symptoms are not resolving.

## 2014-12-27 NOTE — Assessment & Plan Note (Signed)
STAT doppler US obtained and report reviewed -- negative for DVT. Pain most likely MSK in nature giving activities the day before. Discussed RICE. Rx Mobic. Rest for the next couple of days. Call or return to clinic if symptoms are not resolving.

## 2015-04-27 ENCOUNTER — Other Ambulatory Visit: Payer: Self-pay | Admitting: Family Medicine

## 2015-06-04 ENCOUNTER — Encounter: Payer: Self-pay | Admitting: Medical

## 2015-06-04 ENCOUNTER — Ambulatory Visit (INDEPENDENT_AMBULATORY_CARE_PROVIDER_SITE_OTHER): Payer: Commercial Managed Care - HMO | Admitting: Medical

## 2015-06-04 VITALS — BP 128/80 | HR 78 | Temp 98.1°F | Ht 66.0 in | Wt 285.4 lb

## 2015-06-04 DIAGNOSIS — R059 Cough, unspecified: Secondary | ICD-10-CM

## 2015-06-04 DIAGNOSIS — R05 Cough: Secondary | ICD-10-CM | POA: Diagnosis not present

## 2015-06-04 DIAGNOSIS — J309 Allergic rhinitis, unspecified: Secondary | ICD-10-CM | POA: Diagnosis not present

## 2015-06-04 MED ORDER — HYDROCODONE-HOMATROPINE 5-1.5 MG/5ML PO SYRP: 5.0000 mL | ORAL_SOLUTION | Freq: Three times a day (TID) | ORAL | Status: DC | PRN

## 2015-06-04 NOTE — Progress Notes (Signed)
Pre visit review using our clinic review tool, if applicable. No additional management support is needed unless otherwise documented below in the visit note. 

## 2015-06-04 NOTE — Progress Notes (Signed)
Subjective:    Patient ID: Kirsten Nelson, female    DOB: 29-Jan-1961, 54 y.o.   MRN: [id number]  HPI   Pt in with sore throat faint  since last Friday. Runny nose all day. Over weekend had a lot of pnd and cough got worse. Pt is not taking her allergy medicine regular basis. Pt is on montelukast. Xyzal makes her too drowsy. Pt never had much success with nasal sprays.   Review of Systems  Constitutional: Negative for fever, chills and fatigue.  HENT: Positive for congestion, postnasal drip and sneezing. Negative for sinus pressure.   Respiratory: Positive for cough. Negative for chest tightness, shortness of breath and wheezing.        No productive.  Cardiovascular: Negative for chest pain and palpitations.  Neurological: Negative for dizziness and headaches.  Hematological: Negative for adenopathy. Does not bruise/bleed easily.  Psychiatric/Behavioral: Negative for behavioral problems and confusion.    Past Medical History  Diagnosis Date  . HTN (hypertension)   . Environmental allergies   . Allergy     Social History   Social History  . Marital Status: Single    Spouse Name: N/A  . Number of Children: N/A  . Years of Education: N/A   Occupational History  . collections Unemployed   Social History Main Topics  . Smoking status: Never Smoker   . Smokeless tobacco: Never Used  . Alcohol Use: No  . Drug Use: No  . Sexual Activity:    Partners: Male   Other Topics Concern  . Not on file   Social History Narrative   Exercise-- no    Past Surgical History  Procedure Laterality Date  . Tonsillectomy      Family History  Problem Relation Age of Onset  . Hypertension Father   . Leukemia Father   . Lymphoma Father   . Cancer Father   . Hypertension Mother   . Colon cancer Neg Hx   . Esophageal cancer Neg Hx   . Rectal cancer Neg Hx   . Stomach cancer Neg Hx     Allergies  Allergen Reactions  . Ace Inhibitors Cough    Current Outpatient  Prescriptions on File Prior to Visit  Medication Sig Dispense Refill  . levocetirizine (XYZAL) 5 MG tablet Take 5 mg by mouth every evening.    . meloxicam (MOBIC) 7.5 MG tablet Take 1 tablet (7.5 mg total) by mouth daily. 30 tablet 0  . metoprolol succinate (TOPROL-XL) 50 MG 24 hr tablet Take 1 tablet (50 mg total) by mouth daily. Office visit due now 90 tablet 0  . Montelukast Sodium (SINGULAIR PO) Take by mouth daily.     No current facility-administered medications on file prior to visit.    BP 128/80 mmHg  Pulse 78  Temp(Src) 98.1 F (36.7 C) (Oral)  Ht 5\' 6"  (1.676 m)  Wt 285 lb 6.4 oz (129.457 kg)  BMI 46.09 kg/m2  SpO2 98%       Objective:   Physical Exam  General  Mental Status - Alert. General Appearance - Well groomed. Not in acute distress.  Skin Rashes- No Rashes.  HEENT Head- Normal. Ear Auditory Canal - Left- Normal. Right - Normal.Tympanic Membrane- Left- Normal. Right- Normal. Eye Sclera/Conjunctiva- Left- Normal. Right- Normal. Nose & Sinuses Nasal Mucosa- Left-  Boggy and Congested. Right-  Boggy and  Congested.Bilateral no  maxillary and no  frontal sinus pressure. Mouth & Throat Lips: Upper Lip- Normal: no dryness, cracking, pallor,  cyanosis, or vesicular eruption. Lower Lip-Normal: no dryness, cracking, pallor, cyanosis or vesicular eruption. Buccal Mucosa- Bilateral- No Aphthous ulcers. Oropharynx- No Discharge or Erythema. +pnd Tonsils: Characteristics- Bilateral- No Erythema or Congestion. Size/Enlargement- Bilateral- No enlargement. Discharge- bilateral-None.  Neck Neck- Supple. No Masses.   Chest and Lung Exam Auscultation: Breath Sounds:-Clear even and unlabored.  Cardiovascular Auscultation:Rythm- Regular, rate and rhythm. Murmurs & Other Heart Sounds:Ausculatation of the heart reveal- No Murmurs.  Lymphatic Head & Neck General Head & Neck Lymphatics: Bilateral: Description- No Localized lymphadenopathy.       Assessment &  Plan:   For your allergies, I would recommend getting back on the montelukast and using daily.  For your cough, I am prescribing hycodan.  You decline nasal sprays and have had poor response to antihistamines. So if you don't respond to above then we could give you low dose depo medrol.  If you get infection symptoms as discussed then we could send in antibiotic.(if you call within 5-7 days)  Follow up in 7 days or as needed

## 2015-06-04 NOTE — Patient Instructions (Signed)
For your allergies, I would recommend getting back on the montelukast and using daily.  For your cough, I am prescribing hycodan.  You decline nasal sprays and have had poor response to antihistamines. So if you don't respond to above then we could give you low dose depo medrol.  If you get infection symptoms as discussed then we could send in antibiotic.(if you call within 5-7 days)  Follow up in 7 days or as needed

## 2015-08-02 ENCOUNTER — Telehealth: Payer: Self-pay | Admitting: Behavioral Health

## 2015-08-02 NOTE — Telephone Encounter (Signed)
Assessed the following gaps in care: Mammogram Pap Colon (Completed 10/06/13, next due 10/07/2018)  Attempted to reach the patient to discuss the above gaps. Left a message for the patient to return call when available.

## 2015-09-15 ENCOUNTER — Other Ambulatory Visit: Payer: Self-pay | Admitting: Family Medicine

## 2015-12-19 ENCOUNTER — Telehealth: Payer: Self-pay

## 2015-12-19 NOTE — Telephone Encounter (Signed)
Patient received a bill for [facility] 06/04/15 insurance was filed with billing provider as PA submitted it to charge correction for the change told patient to disregard the previous bill

## 2015-12-19 NOTE — Telephone Encounter (Signed)
Patient left a voicemail concerning a bill called patient to get additional information no answer so left a message

## 2015-12-23 ENCOUNTER — Other Ambulatory Visit: Payer: Self-pay | Admitting: Family Medicine

## 2016-02-16 ENCOUNTER — Other Ambulatory Visit: Payer: Self-pay | Admitting: Family Medicine

## 2016-02-17 NOTE — Telephone Encounter (Signed)
Pt has not seen PCP in > 53yrs. Was seen by other Providers in 2016 for acute issues only. Metoprolol request denied until pt schedules appt.  Please call pt for appt ASAP. Thanks!

## 2016-02-18 NOTE — Telephone Encounter (Signed)
Left message for patient informing her that she needs to schedule an appointment prior to any medication refills.

## 2016-03-16 ENCOUNTER — Ambulatory Visit (INDEPENDENT_AMBULATORY_CARE_PROVIDER_SITE_OTHER): Payer: Commercial Managed Care - HMO | Admitting: Family Medicine

## 2016-03-16 ENCOUNTER — Encounter: Payer: Self-pay | Admitting: Family Medicine

## 2016-03-16 VITALS — BP 152/100 | HR 72 | Temp 98.1°F | Resp 18 | Ht 66.0 in | Wt 292.0 lb

## 2016-03-16 DIAGNOSIS — I10 Essential (primary) hypertension: Secondary | ICD-10-CM | POA: Diagnosis not present

## 2016-03-16 DIAGNOSIS — G4700 Insomnia, unspecified: Secondary | ICD-10-CM | POA: Diagnosis not present

## 2016-03-16 LAB — POCT URINALYSIS DIPSTICK
Bilirubin, UA: NEGATIVE
Blood, UA: NEGATIVE
GLUCOSE UA: NEGATIVE
Ketones, UA: NEGATIVE
LEUKOCYTES UA: NEGATIVE
Protein, UA: NEGATIVE
Spec Grav, UA: 1.02
Urobilinogen, UA: NEGATIVE
pH, UA: 6

## 2016-03-16 MED ORDER — METOPROLOL SUCCINATE ER 100 MG PO TB24: 100.0000 mg | ORAL_TABLET | Freq: Every day | ORAL | 3 refills | Status: DC

## 2016-03-16 NOTE — Progress Notes (Signed)
Pre visit review using our clinic review tool, if applicable. No additional management support is needed unless otherwise documented below in the visit note. 

## 2016-03-16 NOTE — Patient Instructions (Signed)
Insomnia Insomnia is a sleep disorder that makes it difficult to fall asleep or to stay asleep. Insomnia can cause tiredness (fatigue), low energy, difficulty concentrating, mood swings, and poor performance at work or school.  There are three different ways to classify insomnia:  Difficulty falling asleep.  Difficulty staying asleep.  Waking up too early in the morning. Any type of insomnia can be long-term (chronic) or short-term (acute). Both are common. Short-term insomnia usually lasts for three months or less. Chronic insomnia occurs at least three times a week for longer than three months. CAUSES  Insomnia may be caused by another condition, situation, or substance, such as:  Anxiety.  Certain medicines.  Gastroesophageal reflux disease (GERD) or other gastrointestinal conditions.  Asthma or other breathing conditions.  Restless legs syndrome, sleep apnea, or other sleep disorders.  Chronic pain.  Menopause. This may include hot flashes.  Stroke.  Abuse of alcohol, tobacco, or illegal drugs.  Depression.  Caffeine.   Neurological disorders, such as Alzheimer disease.  An overactive thyroid (hyperthyroidism). The cause of insomnia may not be known. RISK FACTORS Risk factors for insomnia include:  Gender. Women are more commonly affected than men.  Age. Insomnia is more common as you get older.  Stress. This may involve your professional or personal life.  Income. Insomnia is more common in people with lower income.  Lack of exercise.   Irregular work schedule or night shifts.  Traveling between different time zones. SIGNS AND SYMPTOMS If you have insomnia, trouble falling asleep or trouble staying asleep is the main symptom. This may lead to other symptoms, such as:  Feeling fatigued.  Feeling nervous about going to sleep.  Not feeling rested in the morning.  Having trouble concentrating.  Feeling irritable, anxious, or depressed. TREATMENT   Treatment for insomnia depends on the cause. If your insomnia is caused by an underlying condition, treatment will focus on addressing the condition. Treatment may also include:   Medicines to help you sleep.  Counseling or therapy.  Lifestyle adjustments. HOME CARE INSTRUCTIONS   Take medicines only as directed by your health care provider.  Keep regular sleeping and waking hours. Avoid naps.  Keep a sleep diary to help you and your health care provider figure out what could be causing your insomnia. Include:   When you sleep.  When you wake up during the night.  How well you sleep.   How rested you feel the next day.  Any side effects of medicines you are taking.  What you eat and drink.   Make your bedroom a comfortable place where it is easy to fall asleep:  Put up shades or special blackout curtains to block light from outside.  Use a white noise machine to block noise.  Keep the temperature cool.   Exercise regularly as directed by your health care provider. Avoid exercising right before bedtime.  Use relaxation techniques to manage stress. Ask your health care provider to suggest some techniques that may work well for you. These may include:  Breathing exercises.  Routines to release muscle tension.  Visualizing peaceful scenes.  Cut back on alcohol, caffeinated beverages, and cigarettes, especially close to bedtime. These can disrupt your sleep.  Do not overeat or eat spicy foods right before bedtime. This can lead to digestive discomfort that can make it hard for you to sleep.  Limit screen use before bedtime. This includes:  Watching TV.  Using your smartphone, tablet, and computer.  Stick to a routine. This   can help you fall asleep faster. Try to do a quiet activity, brush your teeth, and go to bed at the same time each night.  Get out of bed if you are still awake after 15 minutes of trying to sleep. Keep the lights down, but try reading or  doing a quiet activity. When you feel sleepy, go back to bed.  Make sure that you drive carefully. Avoid driving if you feel very sleepy.  Keep all follow-up appointments as directed by your health care provider. This is important. SEEK MEDICAL CARE IF:   You are tired throughout the day or have trouble in your daily routine due to sleepiness.  You continue to have sleep problems or your sleep problems get worse. SEEK IMMEDIATE MEDICAL CARE IF:   You have serious thoughts about hurting yourself or someone else.   This information is not intended to replace advice given to you by your health care provider. Make sure you discuss any questions you have with your health care provider.   Document Released: 06/05/2000 Document Revised: 02/27/2015 Document Reviewed: 03/09/2014 Elsevier Interactive Patient Education 2016 [employer] Inc[employer]  

## 2016-03-16 NOTE — Progress Notes (Signed)
Patient ID: Kirsten Nelson, female    DOB: 1961/06/15  Age: 55 y.o. MRN: [id number]    Subjective:  Subjective  HPI MYKAEL HOUZE presents for f/u bp and she can not sleep at night.  Unsure if she snores.  She is tired during the day and never feels rested.    Review of Systems  Constitutional: Negative for appetite change, diaphoresis, fatigue and unexpected weight change.  Eyes: Negative for pain, redness and visual disturbance.  Respiratory: Negative for cough, chest tightness, shortness of breath and wheezing.   Cardiovascular: Negative for chest pain, palpitations and leg swelling.  Endocrine: Negative for cold intolerance, heat intolerance, polydipsia, polyphagia and polyuria.  Genitourinary: Negative for difficulty urinating, dysuria and frequency.  Neurological: Negative for dizziness, light-headedness, numbness and headaches.  Psychiatric/Behavioral: Positive for sleep disturbance. The patient is not nervous/anxious.     History Past Medical History:  Diagnosis Date  . Allergy   . Environmental allergies   . HTN (hypertension)     She has a past surgical history that includes Tonsillectomy.   Her family history includes Cancer in her father; Hypertension in her father and mother; Leukemia in her father; Lymphoma in her father.She reports that she has never smoked. She has never used smokeless tobacco. She reports that she does not drink alcohol or use drugs.  Current Outpatient Prescriptions on File Prior to Visit  Medication Sig Dispense Refill  . levocetirizine (XYZAL) 5 MG tablet Take 5 mg by mouth every evening.    . meloxicam (MOBIC) 7.5 MG tablet Take 1 tablet (7.5 mg total) by mouth daily. 30 tablet 0  . Montelukast Sodium (SINGULAIR PO) Take by mouth daily.     No current facility-administered medications on file prior to visit.      Objective:  Objective  Physical Exam  Constitutional: She is oriented to person, place, and time. She appears well-developed  and well-nourished.  HENT:  Head: Normocephalic and atraumatic.  Eyes: Conjunctivae and EOM are normal.  Neck: Normal range of motion. Neck supple. No JVD present. Carotid bruit is not present. No thyromegaly present.  Cardiovascular: Normal rate, regular rhythm and normal heart sounds.   No murmur heard. Pulmonary/Chest: Effort normal and breath sounds normal. No respiratory distress. She has no wheezes. She has no rales. She exhibits no tenderness.  Musculoskeletal: She exhibits no edema.  Neurological: She is alert and oriented to person, place, and time.  Psychiatric: She has a normal mood and affect. Her behavior is normal. Judgment and thought content normal.  Nursing note and vitals reviewed.  BP (!) 152/100 (BP Location: Right Arm, Patient Position: Sitting, Cuff Size: Large)   Pulse 72   Temp 98.1 F (36.7 C) (Oral)   Resp 18   Ht 5\' 6"  (1.676 m)   Wt 292 lb (132.5 kg)   SpO2 95%   BMI 47.13 kg/m  Wt Readings from Last 3 Encounters:  03/16/16 292 lb (132.5 kg)  06/04/15 285 lb 6.4 oz (129.5 kg)  12/26/14 276 lb 6.4 oz (125.4 kg)     Lab Results  Component Value Date   WBC 11.1 (H) 03/16/2016   HGB 14.3 03/16/2016   HCT 43.0 03/16/2016   PLT 264.0 03/16/2016   GLUCOSE 87 03/16/2016   CHOL 134 03/16/2016   TRIG 146.0 03/16/2016   HDL 50.20 03/16/2016   LDLCALC 54 03/16/2016   ALT 18 03/16/2016   AST 21 03/16/2016   NA 140 03/16/2016   K 4.1 03/16/2016  CL 103 03/16/2016   CREATININE 0.94 03/16/2016   BUN 17 03/16/2016   CO2 28 03/16/2016   TSH 1.42 03/16/2016    US Venous Img Lower Unilateral Left  Result Date: 12/26/2014 CLINICAL DATA:  Left calf pain for 4 days. EXAM: Left LOWER EXTREMITY VENOUS DOPPLER ULTRASOUND TECHNIQUE: Gray-scale sonography with graded compression, as well as color Doppler and duplex ultrasound were performed to evaluate the lower extremity deep venous systems from the level of the common femoral vein and including the common  femoral, femoral, profunda femoral, popliteal and calf veins including the posterior tibial, peroneal and gastrocnemius veins when visible. The superficial great saphenous vein was also interrogated. Spectral Doppler was utilized to evaluate flow at rest and with distal augmentation maneuvers in the common femoral, femoral and popliteal veins. COMPARISON:  None. FINDINGS: Contralateral Common Femoral Vein: Respiratory phasicity is normal and symmetric with the symptomatic side. No evidence of thrombus. Normal compressibility. Common Femoral Vein: No evidence of thrombus. Normal compressibility, respiratory phasicity and response to augmentation. Saphenofemoral Junction: No evidence of thrombus. Normal compressibility and flow on color Doppler imaging. Profunda Femoral Vein: No evidence of thrombus. Normal compressibility and flow on color Doppler imaging. Femoral Vein: No evidence of thrombus. Normal compressibility, respiratory phasicity and response to augmentation. Popliteal Vein: No evidence of thrombus. Normal compressibility, respiratory phasicity and response to augmentation. Calf Veins: No evidence of thrombus. Normal compressibility and flow on color Doppler imaging. Superficial Great Saphenous Vein: No evidence of thrombus. Normal compressibility and flow on color Doppler imaging. Venous Reflux:  None. Other Findings:  None. IMPRESSION: No evidence of deep venous thrombosis seen in left lower extremity. Electronically Signed   By: Marijo Conception, M.D.   On: 12/26/2014 16:12     Assessment & Plan:  Plan  I have discontinued Ms. Eager's HYDROcodone-homatropine and metoprolol succinate. I am also having her start on metoprolol succinate. Additionally, I am having her maintain her levocetirizine, Montelukast Sodium (SINGULAIR PO), and meloxicam.  Meds ordered this encounter  Medications  . metoprolol succinate (TOPROL-XL) 100 MG 24 hr tablet    Sig: Take 1 tablet (100 mg total) by mouth daily. Take  with or immediately following a meal.    Dispense:  90 tablet    Refill:  3    Problem List Items Addressed This Visit      Unprioritized   HTN (hypertension) - Primary    Inc metoprolol to 100 mg  rto 2-3 weeks or sooner prn      Relevant Medications   metoprolol succinate (TOPROL-XL) 100 MG 24 hr tablet   Other Relevant Orders   Lipid panel (Completed)   CBC with Differential/Platelet (Completed)   POCT urinalysis dipstick (Completed)   TSH (Completed)   Comprehensive metabolic panel (Completed)    Other Visit Diagnoses    Insomnia       Relevant Orders   Ambulatory referral to Neurology      Follow-up: Return in about 3 weeks (around 04/06/2016) for hypertension.  Ann Held, DO

## 2016-03-17 LAB — CBC WITH DIFFERENTIAL/PLATELET
Basophils Absolute: 0 10*3/uL (ref 0.0–0.1)
Basophils Relative: 0.4 % (ref 0.0–3.0)
EOS ABS: 0.4 10*3/uL (ref 0.0–0.7)
EOS PCT: 3.4 % (ref 0.0–5.0)
HCT: 43 % (ref 36.0–46.0)
HEMOGLOBIN: 14.3 g/dL (ref 12.0–15.0)
Lymphocytes Relative: 28.6 % (ref 12.0–46.0)
Lymphs Abs: 3.2 10*3/uL (ref 0.7–4.0)
MCHC: 33.2 g/dL (ref 30.0–36.0)
MCV: 84.9 fl (ref 78.0–100.0)
MONO ABS: 0.7 10*3/uL (ref 0.1–1.0)
Monocytes Relative: 6.7 % (ref 3.0–12.0)
Neutro Abs: 6.7 10*3/uL (ref 1.4–7.7)
Neutrophils Relative %: 60.9 % (ref 43.0–77.0)
Platelets: 264 10*3/uL (ref 150.0–400.0)
RBC: 5.06 Mil/uL (ref 3.87–5.11)
RDW: 13.4 % (ref 11.5–15.5)
WBC: 11.1 10*3/uL — ABNORMAL HIGH (ref 4.0–10.5)

## 2016-03-17 LAB — TSH: TSH: 1.42 u[IU]/mL (ref 0.35–4.50)

## 2016-03-17 LAB — COMPREHENSIVE METABOLIC PANEL
ALK PHOS: 86 U/L (ref 39–117)
ALT: 18 U/L (ref 0–35)
AST: 21 U/L (ref 0–37)
Albumin: 3.7 g/dL (ref 3.5–5.2)
BILIRUBIN TOTAL: 0.2 mg/dL (ref 0.2–1.2)
BUN: 17 mg/dL (ref 6–23)
CO2: 28 mEq/L (ref 19–32)
Calcium: 8.9 mg/dL (ref 8.4–10.5)
Chloride: 103 mEq/L (ref 96–112)
Creatinine, Ser: 0.94 mg/dL (ref 0.40–1.20)
GFR: 65.32 mL/min (ref >60.00)
GLUCOSE: 87 mg/dL (ref 70–99)
Potassium: 4.1 mEq/L (ref 3.5–5.1)
SODIUM: 140 meq/L (ref 135–145)
TOTAL PROTEIN: 7.4 g/dL (ref 6.0–8.3)

## 2016-03-17 LAB — LIPID PANEL
Cholesterol: 134 mg/dL (ref 0–200)
HDL: 50.2 mg/dL (ref >39.00)
LDL Cholesterol: 54 mg/dL (ref 0–99)
NONHDL: 83.47
Total CHOL/HDL Ratio: 3
Triglycerides: 146 mg/dL (ref 0.0–149.0)
VLDL: 29.2 mg/dL (ref 0.0–40.0)

## 2016-03-21 NOTE — Assessment & Plan Note (Signed)
Inc metoprolol to 100 mg  rto 2-3 weeks or sooner prn

## 2016-04-17 ENCOUNTER — Ambulatory Visit (INDEPENDENT_AMBULATORY_CARE_PROVIDER_SITE_OTHER): Payer: Commercial Managed Care - HMO | Admitting: Family Medicine

## 2016-04-17 ENCOUNTER — Encounter: Payer: Self-pay | Admitting: Family Medicine

## 2016-04-17 VITALS — BP 138/100 | HR 69 | Temp 98.1°F | Resp 16 | Ht 66.0 in | Wt 292.4 lb

## 2016-04-17 DIAGNOSIS — I10 Essential (primary) hypertension: Secondary | ICD-10-CM | POA: Diagnosis not present

## 2016-04-17 MED ORDER — CARVEDILOL 12.5 MG PO TABS: 12.5000 mg | ORAL_TABLET | Freq: Two times a day (BID) | ORAL | 3 refills | Status: DC

## 2016-04-17 NOTE — Progress Notes (Signed)
Pre visit review using our clinic review tool, if applicable. No additional management support is needed unless otherwise documented below in the visit note. 

## 2016-04-17 NOTE — Patient Instructions (Signed)
Hypertension Hypertension, commonly called high blood pressure, is when the force of blood pumping through your arteries is too strong. Your arteries are the blood vessels that carry blood from your heart throughout your body. A blood pressure reading consists of a higher number over a lower number, such as 110/72. The higher number (systolic) is the pressure inside your arteries when your heart pumps. The lower number (diastolic) is the pressure inside your arteries when your heart relaxes. Ideally you want your blood pressure below 120/80. Hypertension forces your heart to work harder to pump blood. Your arteries may become narrow or stiff. Having untreated or uncontrolled hypertension can cause heart attack, stroke, kidney disease, and other problems. RISK FACTORS Some risk factors for high blood pressure are controllable. Others are not.  Risk factors you cannot control include:   Race. You may be at higher risk if you are African American.  Age. Risk increases with age.  Gender. Men are at higher risk than women before age 45 years. After age 65, women are at higher risk than men. Risk factors you can control include:  Not getting enough exercise or physical activity.  Being overweight.  Getting too much fat, sugar, calories, or salt in your diet.  Drinking too much alcohol. SIGNS AND SYMPTOMS Hypertension does not usually cause signs or symptoms. Extremely high blood pressure (hypertensive crisis) may cause headache, anxiety, shortness of breath, and nosebleed. DIAGNOSIS To check if you have hypertension, your health care provider will measure your blood pressure while you are seated, with your arm held at the level of your heart. It should be measured at least twice using the same arm. Certain conditions can cause a difference in blood pressure between your right and left arms. A blood pressure reading that is higher than normal on one occasion does not mean that you need treatment. If  it is not clear whether you have high blood pressure, you may be asked to return on a different day to have your blood pressure checked again. Or, you may be asked to monitor your blood pressure at home for 1 or more weeks. TREATMENT Treating high blood pressure includes making lifestyle changes and possibly taking medicine. Living a healthy lifestyle can help lower high blood pressure. You may need to change some of your habits. Lifestyle changes may include:  Following the DASH diet. This diet is high in fruits, vegetables, and whole grains. It is low in salt, red meat, and added sugars.  Keep your sodium intake below 2,300 mg per day.  Getting at least 30-45 minutes of aerobic exercise at least 4 times per week.  Losing weight if necessary.  Not smoking.  Limiting alcoholic beverages.  Learning ways to reduce stress. Your health care provider may prescribe medicine if lifestyle changes are not enough to get your blood pressure under control, and if one of the following is true:  You are 18-59 years of age and your systolic blood pressure is above 140.  You are 60 years of age or older, and your systolic blood pressure is above 150.  Your diastolic blood pressure is above 90.  You have diabetes, and your systolic blood pressure is over 140 or your diastolic blood pressure is over 90.  You have kidney disease and your blood pressure is above 140/90.  You have heart disease and your blood pressure is above 140/90. Your personal target blood pressure may vary depending on your medical conditions, your age, and other factors. HOME CARE INSTRUCTIONS    Have your blood pressure rechecked as directed by your health care provider.   Take medicines only as directed by your health care provider. Follow the directions carefully. Blood pressure medicines must be taken as prescribed. The medicine does not work as well when you skip doses. Skipping doses also puts you at risk for  problems.  Do not smoke.   Monitor your blood pressure at home as directed by your health care provider. SEEK MEDICAL CARE IF:   You think you are having a reaction to medicines taken.  You have recurrent headaches or feel dizzy.  You have swelling in your ankles.  You have trouble with your vision. SEEK IMMEDIATE MEDICAL CARE IF:  You develop a severe headache or confusion.  You have unusual weakness, numbness, or feel faint.  You have severe chest or abdominal pain.  You vomit repeatedly.  You have trouble breathing. MAKE SURE YOU:   Understand these instructions.  Will watch your condition.  Will get help right away if you are not doing well or get worse.   This information is not intended to replace advice given to you by your health care provider. Make sure you discuss any questions you have with your health care provider.   Document Released: 06/08/2005 Document Revised: 10/23/2014 Document Reviewed: 03/31/2013 Elsevier Interactive Patient Education 2016 [employer]vier Inc.  

## 2016-04-17 NOTE — Progress Notes (Signed)
Patient ID: Kirsten Nelson, female    DOB: 1960/12/07  Age: 55 y.o. MRN: [id number]    Subjective:  Subjective  HPI Kirsten Nelson presents for bp f/u-- the toprol is too expensive.  She would like to change meds.  No other complaints.   Review of Systems  Constitutional: Negative for appetite change, diaphoresis, fatigue and unexpected weight change.  Eyes: Negative for pain, redness and visual disturbance.  Respiratory: Negative for cough, chest tightness, shortness of breath and wheezing.   Cardiovascular: Negative for chest pain, palpitations and leg swelling.  Endocrine: Negative for cold intolerance, heat intolerance, polydipsia, polyphagia and polyuria.  Genitourinary: Negative for difficulty urinating, dysuria and frequency.  Neurological: Negative for dizziness, light-headedness, numbness and headaches.    History Past Medical History:  Diagnosis Date  . Allergy   . Environmental allergies   . HTN (hypertension)     She has a past surgical history that includes Tonsillectomy.   Her family history includes Cancer in her father; Hypertension in her father and mother; Leukemia in her father; Lymphoma in her father.She reports that she has never smoked. She has never used smokeless tobacco. She reports that she does not drink alcohol or use drugs.  Current Outpatient Prescriptions on File Prior to Visit  Medication Sig Dispense Refill  . levocetirizine (XYZAL) 5 MG tablet Take 5 mg by mouth every evening.    . meloxicam (MOBIC) 7.5 MG tablet Take 1 tablet (7.5 mg total) by mouth daily. 30 tablet 0  . Montelukast Sodium (SINGULAIR PO) Take by mouth daily.     No current facility-administered medications on file prior to visit.      Objective:  Objective  Physical Exam  Constitutional: She is oriented to person, place, and time. She appears well-developed and well-nourished.  HENT:  Head: Normocephalic and atraumatic.  Eyes: Conjunctivae and EOM are normal.  Neck:  Normal range of motion. Neck supple. No JVD present. Carotid bruit is not present. No thyromegaly present.  Cardiovascular: Normal rate, regular rhythm and normal heart sounds.   No murmur heard. Pulmonary/Chest: Effort normal and breath sounds normal. No respiratory distress. She has no wheezes. She has no rales. She exhibits no tenderness.  Musculoskeletal: She exhibits no edema.  Neurological: She is alert and oriented to person, place, and time.  Psychiatric: She has a normal mood and affect. Her behavior is normal. Judgment and thought content normal.  Nursing note and vitals reviewed.  BP (!) 138/100 (BP Location: Left Arm, Patient Position: Sitting, Cuff Size: Large)   Pulse 69   Temp 98.1 F (36.7 C) (Oral)   Resp 16   Ht 5\' 6"  (1.676 m)   Wt 292 lb 6.4 oz (132.6 kg)   SpO2 94%   BMI 47.19 kg/m  Wt Readings from Last 3 Encounters:  04/17/16 292 lb 6.4 oz (132.6 kg)  03/16/16 292 lb (132.5 kg)  06/04/15 285 lb 6.4 oz (129.5 kg)     Lab Results  Component Value Date   WBC 11.1 (H) 03/16/2016   HGB 14.3 03/16/2016   HCT 43.0 03/16/2016   PLT 264.0 03/16/2016   GLUCOSE 87 03/16/2016   CHOL 134 03/16/2016   TRIG 146.0 03/16/2016   HDL 50.20 03/16/2016   LDLCALC 54 03/16/2016   ALT 18 03/16/2016   AST 21 03/16/2016   NA 140 03/16/2016   K 4.1 03/16/2016   CL 103 03/16/2016   CREATININE 0.94 03/16/2016   BUN 17 03/16/2016   CO2 28  03/16/2016   TSH 1.42 03/16/2016    US Venous Img Lower Unilateral Left  Result Date: 12/26/2014 CLINICAL DATA:  Left calf pain for 4 days. EXAM: Left LOWER EXTREMITY VENOUS DOPPLER ULTRASOUND TECHNIQUE: Gray-scale sonography with graded compression, as well as color Doppler and duplex ultrasound were performed to evaluate the lower extremity deep venous systems from the level of the common femoral vein and including the common femoral, femoral, profunda femoral, popliteal and calf veins including the posterior tibial, peroneal and  gastrocnemius veins when visible. The superficial great saphenous vein was also interrogated. Spectral Doppler was utilized to evaluate flow at rest and with distal augmentation maneuvers in the common femoral, femoral and popliteal veins. COMPARISON:  None. FINDINGS: Contralateral Common Femoral Vein: Respiratory phasicity is normal and symmetric with the symptomatic side. No evidence of thrombus. Normal compressibility. Common Femoral Vein: No evidence of thrombus. Normal compressibility, respiratory phasicity and response to augmentation. Saphenofemoral Junction: No evidence of thrombus. Normal compressibility and flow on color Doppler imaging. Profunda Femoral Vein: No evidence of thrombus. Normal compressibility and flow on color Doppler imaging. Femoral Vein: No evidence of thrombus. Normal compressibility, respiratory phasicity and response to augmentation. Popliteal Vein: No evidence of thrombus. Normal compressibility, respiratory phasicity and response to augmentation. Calf Veins: No evidence of thrombus. Normal compressibility and flow on color Doppler imaging. Superficial Great Saphenous Vein: No evidence of thrombus. Normal compressibility and flow on color Doppler imaging. Venous Reflux:  None. Other Findings:  None. IMPRESSION: No evidence of deep venous thrombosis seen in left lower extremity. Electronically Signed   By: Marijo Conception, M.D.   On: 12/26/2014 16:12     Assessment & Plan:  Plan  I have discontinued Ms. Bellizzi's metoprolol succinate. I am also having her start on carvedilol. Additionally, I am having her maintain her levocetirizine, Montelukast Sodium (SINGULAIR PO), and meloxicam.  Meds ordered this encounter  Medications  . carvedilol (COREG) 12.5 MG tablet    Sig: Take 1 tablet (12.5 mg total) by mouth 2 (two) times daily with a meal.    Dispense:  60 tablet    Refill:  3    Problem List Items Addressed This Visit      Unprioritized   HTN (hypertension) - Primary     Relevant Medications   carvedilol (COREG) 12.5 MG tablet    Other Visit Diagnoses   None.     Follow-up: Return in about 4 weeks (around 05/15/2016) for hypertension.  Ann Held, DO

## 2016-05-19 ENCOUNTER — Encounter: Payer: Self-pay | Admitting: Family Medicine

## 2016-05-19 ENCOUNTER — Ambulatory Visit (INDEPENDENT_AMBULATORY_CARE_PROVIDER_SITE_OTHER): Payer: Commercial Managed Care - HMO | Admitting: Family Medicine

## 2016-05-19 VITALS — BP 122/88 | HR 77 | Temp 98.2°F | Resp 16 | Ht 66.0 in | Wt 291.0 lb

## 2016-05-19 DIAGNOSIS — D72829 Elevated white blood cell count, unspecified: Secondary | ICD-10-CM | POA: Diagnosis not present

## 2016-05-19 DIAGNOSIS — I10 Essential (primary) hypertension: Secondary | ICD-10-CM | POA: Diagnosis not present

## 2016-05-19 MED ORDER — CARVEDILOL 12.5 MG PO TABS: 12.5000 mg | ORAL_TABLET | Freq: Two times a day (BID) | ORAL | 3 refills | Status: DC

## 2016-05-19 NOTE — Addendum Note (Signed)
Addended by: Roma Schanz R on: 05/19/2016 09:33 AM   Modules accepted: Orders

## 2016-05-19 NOTE — Progress Notes (Addendum)
Patient ID: Kirsten Nelson, female    DOB: March 27, 1961  Age: 55 y.o. MRN: [id number]    Subjective:  Subjective  HPI [name] presents for f/u bp only.  She sometimes forgets the evening dose of coreg but otherwise is doing well.  No complaints   Review of Systems  Constitutional: Negative for appetite change, diaphoresis, fatigue and unexpected weight change.  Eyes: Negative for pain, redness and visual disturbance.  Respiratory: Negative for cough, chest tightness, shortness of breath and wheezing.   Cardiovascular: Negative for chest pain, palpitations and leg swelling.  Endocrine: Negative for cold intolerance, heat intolerance, polydipsia, polyphagia and polyuria.  Genitourinary: Negative for difficulty urinating, dysuria and frequency.  Neurological: Negative for dizziness, light-headedness, numbness and headaches.    History Past Medical History:  Diagnosis Date  . Allergy   . Environmental allergies   . HTN (hypertension)     She has a past surgical history that includes Tonsillectomy.   Her family history includes Cancer in her father; Hypertension in her father and mother; Leukemia in her father; Lymphoma in her father.She reports that she has never smoked. She has never used smokeless tobacco. She reports that she does not drink alcohol or use drugs.  Current Outpatient Prescriptions on File Prior to Visit  Medication Sig Dispense Refill  . levocetirizine (XYZAL) 5 MG tablet Take 5 mg by mouth every evening.    . meloxicam (MOBIC) 7.5 MG tablet Take 1 tablet (7.5 mg total) by mouth daily. 30 tablet 0  . Montelukast Sodium (SINGULAIR PO) Take by mouth daily.     No current facility-administered medications on file prior to visit.      Objective:  Objective  Physical Exam  Constitutional: She is oriented to person, place, and time. She appears well-developed and well-nourished.  HENT:  Head: Normocephalic and atraumatic.  Eyes: Conjunctivae and EOM are  normal.  Neck: Normal range of motion. Neck supple. No JVD present. Carotid bruit is not present. No thyromegaly present.  Cardiovascular: Normal rate, regular rhythm and normal heart sounds.   No murmur heard. Pulmonary/Chest: Effort normal and breath sounds normal. No respiratory distress. She has no wheezes. She has no rales. She exhibits no tenderness.  Musculoskeletal: She exhibits no edema.  Neurological: She is alert and oriented to person, place, and time.  Psychiatric: She has a normal mood and affect. Her behavior is normal. Judgment and thought content normal.  Nursing note and vitals reviewed.  BP 122/88 (BP Location: Left Arm, Patient Position: Sitting, Cuff Size: Large)   Pulse 77   Temp 98.2 F (36.8 C) (Oral)   Resp 16   Ht 5\' 6"  (1.676 m)   Wt 291 lb (132 kg)   SpO2 96%   BMI 46.97 kg/m  Wt Readings from Last 3 Encounters:  05/19/16 291 lb (132 kg)  04/17/16 292 lb 6.4 oz (132.6 kg)  03/16/16 292 lb (132.5 kg)     Lab Results  Component Value Date   WBC 11.1 (H) 03/16/2016   HGB 14.3 03/16/2016   HCT 43.0 03/16/2016   PLT 264.0 03/16/2016   GLUCOSE 87 03/16/2016   CHOL 134 03/16/2016   TRIG 146.0 03/16/2016   HDL 50.20 03/16/2016   LDLCALC 54 03/16/2016   ALT 18 03/16/2016   AST 21 03/16/2016   NA 140 03/16/2016   K 4.1 03/16/2016   CL 103 03/16/2016   CREATININE 0.94 03/16/2016   BUN 17 03/16/2016   CO2 28 03/16/2016  TSH 1.42 03/16/2016    US Venous Img Lower Unilateral Left  Result Date: 12/26/2014 CLINICAL DATA:  Left calf pain for 4 days. EXAM: Left LOWER EXTREMITY VENOUS DOPPLER ULTRASOUND TECHNIQUE: Gray-scale sonography with graded compression, as well as color Doppler and duplex ultrasound were performed to evaluate the lower extremity deep venous systems from the level of the common femoral vein and including the common femoral, femoral, profunda femoral, popliteal and calf veins including the posterior tibial, peroneal and gastrocnemius  veins when visible. The superficial great saphenous vein was also interrogated. Spectral Doppler was utilized to evaluate flow at rest and with distal augmentation maneuvers in the common femoral, femoral and popliteal veins. COMPARISON:  None. FINDINGS: Contralateral Common Femoral Vein: Respiratory phasicity is normal and symmetric with the symptomatic side. No evidence of thrombus. Normal compressibility. Common Femoral Vein: No evidence of thrombus. Normal compressibility, respiratory phasicity and response to augmentation. Saphenofemoral Junction: No evidence of thrombus. Normal compressibility and flow on color Doppler imaging. Profunda Femoral Vein: No evidence of thrombus. Normal compressibility and flow on color Doppler imaging. Femoral Vein: No evidence of thrombus. Normal compressibility, respiratory phasicity and response to augmentation. Popliteal Vein: No evidence of thrombus. Normal compressibility, respiratory phasicity and response to augmentation. Calf Veins: No evidence of thrombus. Normal compressibility and flow on color Doppler imaging. Superficial Great Saphenous Vein: No evidence of thrombus. Normal compressibility and flow on color Doppler imaging. Venous Reflux:  None. Other Findings:  None. IMPRESSION: No evidence of deep venous thrombosis seen in left lower extremity. Electronically Signed   By: Marijo Conception, M.D.   On: 12/26/2014 16:12     Assessment & Plan:  Plan  I am having Ms. Lyness maintain her levocetirizine, Montelukast Sodium (SINGULAIR PO), meloxicam, and carvedilol.  Meds ordered this encounter  Medications  . carvedilol (COREG) 12.5 MG tablet    Sig: Take 1 tablet (12.5 mg total) by mouth 2 (two) times daily with a meal.    Dispense:  60 tablet    Refill:  3    Problem List Items Addressed This Visit      Unprioritized   HTN (hypertension) - Primary   Relevant Medications   carvedilol (COREG) 12.5 MG tablet    Other Visit Diagnoses    Leukocytosis,  unspecified type       Relevant Orders   CBC with Differential/Platelet    stable, rto 3 months or sooner   Follow-up: Return in about 3 months (around 08/19/2016) for hypertension.  Ann Held, DO

## 2016-05-19 NOTE — Progress Notes (Signed)
Pre visit review using our clinic review tool, if applicable. No additional management support is needed unless otherwise documented below in the visit note. 

## 2016-05-19 NOTE — Patient Instructions (Signed)
Hypertension Hypertension, commonly called high blood pressure, is when the force of blood pumping through your arteries is too strong. Your arteries are the blood vessels that carry blood from your heart throughout your body. A blood pressure reading consists of a higher number over a lower number, such as 110/72. The higher number (systolic) is the pressure inside your arteries when your heart pumps. The lower number (diastolic) is the pressure inside your arteries when your heart relaxes. Ideally you want your blood pressure below 120/80. Hypertension forces your heart to work harder to pump blood. Your arteries may become narrow or stiff. Having untreated or uncontrolled hypertension can cause heart attack, stroke, kidney disease, and other problems. What increases the risk? Some risk factors for high blood pressure are controllable. Others are not. Risk factors you cannot control include:  Race. You may be at higher risk if you are African American.  Age. Risk increases with age.  Gender. Men are at higher risk than women before age 45 years. After age 65, women are at higher risk than men. Risk factors you can control include:  Not getting enough exercise or physical activity.  Being overweight.  Getting too much fat, sugar, calories, or salt in your diet.  Drinking too much alcohol. What are the signs or symptoms? Hypertension does not usually cause signs or symptoms. Extremely high blood pressure (hypertensive crisis) may cause headache, anxiety, shortness of breath, and nosebleed. How is this diagnosed? To check if you have hypertension, your health care provider will measure your blood pressure while you are seated, with your arm held at the level of your heart. It should be measured at least twice using the same arm. Certain conditions can cause a difference in blood pressure between your right and left arms. A blood pressure reading that is higher than normal on one occasion does  not mean that you need treatment. If it is not clear whether you have high blood pressure, you may be asked to return on a different day to have your blood pressure checked again. Or, you may be asked to monitor your blood pressure at home for 1 or more weeks. How is this treated? Treating high blood pressure includes making lifestyle changes and possibly taking medicine. Living a healthy lifestyle can help lower high blood pressure. You may need to change some of your habits. Lifestyle changes may include:  Following the DASH diet. This diet is high in fruits, vegetables, and whole grains. It is low in salt, red meat, and added sugars.  Keep your sodium intake below 2,300 mg per day.  Getting at least 30-45 minutes of aerobic exercise at least 4 times per week.  Losing weight if necessary.  Not smoking.  Limiting alcoholic beverages.  Learning ways to reduce stress. Your health care provider may prescribe medicine if lifestyle changes are not enough to get your blood pressure under control, and if one of the following is true:  You are 18-59 years of age and your systolic blood pressure is above 140.  You are 60 years of age or older, and your systolic blood pressure is above 150.  Your diastolic blood pressure is above 90.  You have diabetes, and your systolic blood pressure is over 140 or your diastolic blood pressure is over 90.  You have kidney disease and your blood pressure is above 140/90.  You have heart disease and your blood pressure is above 140/90. Your personal target blood pressure may vary depending on your medical   conditions, your age, and other factors. Follow these instructions at home:  Have your blood pressure rechecked as directed by your health care provider.  Take medicines only as directed by your health care provider. Follow the directions carefully. Blood pressure medicines must be taken as prescribed. The medicine does not work as well when you skip  doses. Skipping doses also puts you at risk for problems.  Do not smoke.  Monitor your blood pressure at home as directed by your health care provider. Contact a health care provider if:  You think you are having a reaction to medicines taken.  You have recurrent headaches or feel dizzy.  You have swelling in your ankles.  You have trouble with your vision. Get help right away if:  You develop a severe headache or confusion.  You have unusual weakness, numbness, or feel faint.  You have severe chest or abdominal pain.  You vomit repeatedly.  You have trouble breathing. This information is not intended to replace advice given to you by your health care provider. Make sure you discuss any questions you have with your health care provider. Document Released: 06/08/2005 Document Revised: 11/14/2015 Document Reviewed: 03/31/2013 Elsevier Interactive Patient Education  2017 Elsevier Inc.  

## 2016-05-20 LAB — CBC WITH DIFFERENTIAL/PLATELET
Basophils Absolute: 0 10*3/uL (ref 0.0–0.1)
Basophils Relative: 0.4 % (ref 0.0–3.0)
Eosinophils Absolute: 0.3 10*3/uL (ref 0.0–0.7)
Eosinophils Relative: 4.1 % (ref 0.0–5.0)
HCT: 44.2 % (ref 36.0–46.0)
HEMOGLOBIN: 14.5 g/dL (ref 12.0–15.0)
LYMPHS ABS: 2.1 10*3/uL (ref 0.7–4.0)
Lymphocytes Relative: 25.2 % (ref 12.0–46.0)
MCHC: 32.8 g/dL (ref 30.0–36.0)
MCV: 85.4 fl (ref 78.0–100.0)
MONO ABS: 0.5 10*3/uL (ref 0.1–1.0)
MONOS PCT: 6.5 % (ref 3.0–12.0)
Neutro Abs: 5.3 10*3/uL (ref 1.4–7.7)
Neutrophils Relative %: 63.8 % (ref 43.0–77.0)
Platelets: 239 10*3/uL (ref 150.0–400.0)
RBC: 5.18 Mil/uL — ABNORMAL HIGH (ref 3.87–5.11)
RDW: 13.7 % (ref 11.5–15.5)
WBC: 8.3 10*3/uL (ref 4.0–10.5)

## 2016-07-29 DIAGNOSIS — J3089 Other allergic rhinitis: Secondary | ICD-10-CM | POA: Diagnosis not present

## 2016-07-29 DIAGNOSIS — J301 Allergic rhinitis due to pollen: Secondary | ICD-10-CM | POA: Diagnosis not present

## 2016-07-29 DIAGNOSIS — J3081 Allergic rhinitis due to animal (cat) (dog) hair and dander: Secondary | ICD-10-CM | POA: Diagnosis not present

## 2016-08-05 ENCOUNTER — Ambulatory Visit (INDEPENDENT_AMBULATORY_CARE_PROVIDER_SITE_OTHER): Payer: Commercial Managed Care - HMO | Admitting: Medical

## 2016-08-05 ENCOUNTER — Encounter: Payer: Self-pay | Admitting: Medical

## 2016-08-05 VITALS — BP 111/77 | HR 90 | Temp 97.3°F | Resp 16 | Ht 66.0 in | Wt 284.4 lb

## 2016-08-05 DIAGNOSIS — R05 Cough: Secondary | ICD-10-CM

## 2016-08-05 DIAGNOSIS — R059 Cough, unspecified: Secondary | ICD-10-CM

## 2016-08-05 DIAGNOSIS — J040 Acute laryngitis: Secondary | ICD-10-CM | POA: Diagnosis not present

## 2016-08-05 MED ORDER — BENZONATATE 100 MG PO CAPS: 100.0000 mg | ORAL_CAPSULE | Freq: Three times a day (TID) | ORAL | 0 refills | Status: DC | PRN

## 2016-08-05 NOTE — Patient Instructions (Addendum)
For your laryngitis recommend rest, hydration and rest voice. (work note excuse until Monday)  For congestion can use corcidin.  For cough benzonatate.  I don't think antibiotic needed but if symptoms worsen or change let us know.  Follow up 7 days or as needed.

## 2016-08-05 NOTE — Progress Notes (Signed)
Subjective:    Patient ID: Kirsten Nelson, female    DOB: [birth year], 56 y.o.   MRN: [id number]  HPI  Pt in with some hoarse voice on and off since Sunday. She states got caught in a little rain. She is having some mild cough. Cough is dry. She is able to sleep. No obvious pnd.   Pt states at her job has to talk all day long.  No fever, no chills or sweats. No myalgia.   Review of Systems  Constitutional: Negative for chills, fatigue and fever.  HENT: Positive for congestion and voice change. Negative for ear pain, postnasal drip, sinus pressure and sore throat.   Respiratory: Positive for cough. Negative for chest tightness, shortness of breath and wheezing.        Rare minimal cough on occasion.  Cardiovascular: Negative for chest pain and palpitations.  Gastrointestinal: Negative for abdominal pain.  Musculoskeletal: Negative for back pain.  Skin: Negative for rash.  Neurological: Negative for dizziness, weakness and headaches.  Hematological: Negative for adenopathy. Does not bruise/bleed easily.  Psychiatric/Behavioral: Negative for behavioral problems and confusion.    Past Medical History:  Diagnosis Date  . Allergy   . Environmental allergies   . HTN (hypertension)      Social History   Social History  . Marital status: Single    Spouse name: N/A  . Number of children: N/A  . Years of education: N/A   Occupational History  . collections Unemployed   Social History Main Topics  . Smoking status: Never Smoker  . Smokeless tobacco: Never Used  . Alcohol use No  . Drug use: No  . Sexual activity: Not Currently    Partners: Male   Other Topics Concern  . Not on file   Social History Narrative   Exercise-- no    Past Surgical History:  Procedure Laterality Date  . TONSILLECTOMY      Family History  Problem Relation Age of Onset  . Hypertension Father   . Leukemia Father   . Lymphoma Father   . Cancer Father   . Hypertension Mother   . Colon  cancer Neg Hx   . Esophageal cancer Neg Hx   . Rectal cancer Neg Hx   . Stomach cancer Neg Hx     Allergies  Allergen Reactions  . Ace Inhibitors Cough    Current Outpatient Prescriptions on File Prior to Visit  Medication Sig Dispense Refill  . carvedilol (COREG) 12.5 MG tablet Take 1 tablet (12.5 mg total) by mouth 2 (two) times daily with a meal. 60 tablet 3  . levocetirizine (XYZAL) 5 MG tablet Take 5 mg by mouth daily as needed.     . meloxicam (MOBIC) 7.5 MG tablet Take 1 tablet (7.5 mg total) by mouth daily. 30 tablet 0  . Montelukast Sodium (SINGULAIR PO) Take 10 mg by mouth daily as needed.      No current facility-administered medications on file prior to visit.     BP 111/77 (BP Location: Right Arm, Patient Position: Sitting, Cuff Size: Large)   Pulse 90   Temp 97.3 F (36.3 C) (Oral)   Resp 16   Ht 5\' 6"  (1.676 m)   Wt 284 lb 6 oz (129 kg)   SpO2 97%   BMI 45.90 kg/m       Objective:   Physical Exam   General  Mental Status - Alert. General Appearance - Well groomed. Not in acute distress.  Skin Rashes-  No Rashes.  HEENT Head- Normal. Ear Auditory Canal - Left- Normal. Right - Normal.Tympanic Membrane- Left- Normal. Right- Normal. Eye Sclera/Conjunctiva- Left- Normal. Right- Normal. Nose & Sinuses Nasal Mucosa- Left-  Boggy and Congested. Right-  Boggy and  Congested.Bilateral no  maxillary and  No frontal sinus pressure. Mouth & Throat Lips: Upper Lip- Normal: no dryness, cracking, pallor, cyanosis, or vesicular eruption. Lower Lip-Normal: no dryness, cracking, pallor, cyanosis or vesicular eruption. Buccal Mucosa- Bilateral- No Aphthous ulcers. Oropharynx- No Discharge or Erythema. Tonsils: Characteristics- Bilateral- No Erythema or Congestion. Size/Enlargement- Bilateral- No enlargement. Discharge- bilateral-None.  Neck Neck- Supple. No Masses.   Chest and Lung Exam Auscultation: Breath Sounds:-Clear even and  unlabored.  Cardiovascular Auscultation:Rythm- Regular, rate and rhythm. Murmurs & Other Heart Sounds:Ausculatation of the heart reveal- No Murmurs.  Lymphatic Head & Neck General Head & Neck Lymphatics: Bilateral: Description- No Localized lymphadenopathy.      Assessment & Plan:  For your laryngitis recommend rest, hydration and rest voice. (work note excuse until Monday)  For congestion can use corcidin.  For cough benzonatate.  I don't think antibiotic needed but if symptoms worsen or change let us know.  Follow up 7 days or as needed.  Lino Wickliff, Percell Clinard, PA-C

## 2016-08-05 NOTE — Progress Notes (Signed)
Pre visit review using our clinic review tool, if applicable. No additional management support is needed unless otherwise documented below in the visit note/SLS  

## 2016-09-30 ENCOUNTER — Other Ambulatory Visit: Payer: Self-pay | Admitting: Family Medicine

## 2016-09-30 DIAGNOSIS — I10 Essential (primary) hypertension: Secondary | ICD-10-CM

## 2016-10-01 ENCOUNTER — Other Ambulatory Visit: Payer: Self-pay | Admitting: Medical

## 2016-10-02 ENCOUNTER — Telehealth: Payer: Self-pay | Admitting: Family Medicine

## 2016-10-02 NOTE — Telephone Encounter (Signed)
Relation to PZ:WCHE Call back number:[phone] Pharmacy: [id number] - [place], [place] [phone] (Phone) [phone] (Fax)    Reason for call:  Patient requesting a refill benzonatate (TESSALON) 100 MG capsule due to her cough patient stated Percell Lagman prescribed, please advise

## 2016-10-02 NOTE — Telephone Encounter (Signed)
Pt is request Tessalon refill. Pt stated her allergies are getting bad and she has been coughing more.   Please advise.

## 2016-10-02 NOTE — Telephone Encounter (Signed)
Routed request to [facility]. (See refill 10/02/16)

## 2016-10-06 NOTE — Telephone Encounter (Signed)
I signed rx for benzonatate. But would you offer pt appointment for this Friday or Monday. See what is available 8-9 or 1-2 in afternoon.   Document she was offered appointment.

## 2016-10-08 NOTE — Telephone Encounter (Signed)
Left pt a message to call back. 

## 2016-11-30 ENCOUNTER — Encounter: Payer: Self-pay | Admitting: Family Medicine

## 2016-11-30 ENCOUNTER — Ambulatory Visit (INDEPENDENT_AMBULATORY_CARE_PROVIDER_SITE_OTHER): Payer: Commercial Managed Care - HMO | Admitting: Family Medicine

## 2016-11-30 VITALS — BP 116/80 | HR 69 | Temp 97.6°F | Resp 16 | Ht 66.0 in | Wt 282.0 lb

## 2016-11-30 DIAGNOSIS — J301 Allergic rhinitis due to pollen: Secondary | ICD-10-CM | POA: Diagnosis not present

## 2016-11-30 DIAGNOSIS — R059 Cough, unspecified: Secondary | ICD-10-CM

## 2016-11-30 DIAGNOSIS — R05 Cough: Secondary | ICD-10-CM

## 2016-11-30 MED ORDER — PROMETHAZINE-DM 6.25-15 MG/5ML PO SYRP: 5.0000 mL | ORAL_SOLUTION | Freq: Four times a day (QID) | ORAL | 0 refills | Status: DC | PRN

## 2016-11-30 MED FILL — PROMETHAZINE-DM SYRUP: 6.25-15 | 6 days supply | Qty: 118 | Fill #0

## 2016-11-30 NOTE — Progress Notes (Signed)
Patient ID: Kirsten Nelson, female   DOB: 03-16-1961, 56 y.o.   MRN: [id number]     Subjective:  I acted as a Education administrator for Dr. Carollee Herter.  [name], [name]   Patient ID: Kirsten Nelson, female    DOB: 08/11/1960, 56 y.o.   MRN: [id number]  Chief Complaint  Patient presents with  . Cough    started yesterday morning    Cough  This is a recurrent problem. The current episode started yesterday. The problem occurs constantly. The cough is non-productive (dry cough). Associated symptoms include nasal congestion, postnasal drip, rhinorrhea and shortness of breath. Pertinent negatives include no chest pain, fever, headaches, rash or wheezing. She has tried prescription cough suppressant for the symptoms. The treatment provided mild relief.    Patient is in today for cough.  Usually benzonatate works but did not work last night and she cough all night long, she could not sleep.   Patient Care Team: Carollee Herter, Alferd Apa, DO as PCP - General (Family Medicine)   Past Medical History:  Diagnosis Date  . Allergy   . Environmental allergies   . HTN (hypertension)     Past Surgical History:  Procedure Laterality Date  . TONSILLECTOMY      Family History  Problem Relation Age of Onset  . Hypertension Father   . Leukemia Father   . Lymphoma Father   . Cancer Father   . Hypertension Mother   . Colon cancer Neg Hx   . Esophageal cancer Neg Hx   . Rectal cancer Neg Hx   . Stomach cancer Neg Hx     Social History   Social History  . Marital status: Single    Spouse name: N/A  . Number of children: N/A  . Years of education: N/A   Occupational History  . collections Unemployed   Social History Main Topics  . Smoking status: Never Smoker  . Smokeless tobacco: Never Used  . Alcohol use No  . Drug use: No  . Sexual activity: Not Currently    Partners: Male   Other Topics Concern  . Not on file   Social History Narrative   Exercise-- no    Outpatient Medications Prior to  Visit  Medication Sig Dispense Refill  . benzonatate (TESSALON) 100 MG capsule TAKE 1 CAPSULE (100 MG TOTAL) BY MOUTH 3 (THREE) TIMES DAILY AS NEEDED FOR COUGH. 21 capsule 0  . carvedilol (COREG) 12.5 MG tablet Take 1 tablet (12.5 mg total) by mouth 2 (two) times daily with a meal. 60 tablet 3  . carvedilol (COREG) 12.5 MG tablet TAKE 1 TABLET (12.5 MG TOTAL) BY MOUTH 2 (TWO) TIMES DAILY WITH A MEAL. 60 tablet 3  . levocetirizine (XYZAL) 5 MG tablet Take 5 mg by mouth daily as needed.     . meloxicam (MOBIC) 7.5 MG tablet Take 1 tablet (7.5 mg total) by mouth daily. 30 tablet 0  . Montelukast Sodium (SINGULAIR PO) Take 10 mg by mouth daily as needed.      No facility-administered medications prior to visit.     Allergies  Allergen Reactions  . Ace Inhibitors Cough    Review of Systems  Constitutional: Negative for fever and malaise/fatigue.  HENT: Positive for postnasal drip and rhinorrhea. Negative for congestion.   Eyes: Negative for blurred vision.  Respiratory: Positive for cough and shortness of breath. Negative for wheezing.   Cardiovascular: Negative for chest pain, palpitations and leg swelling.  Gastrointestinal: Negative for vomiting.  Musculoskeletal:  Negative for back pain.  Skin: Negative for rash.  Neurological: Negative for loss of consciousness and headaches.       Objective:    Physical Exam  Constitutional: She is oriented to person, place, and time. She appears well-developed and well-nourished.  HENT:  Right Ear: External ear normal.  Left Ear: External ear normal.  + PND + errythema  Eyes: Conjunctivae are normal. Right eye exhibits no discharge. Left eye exhibits no discharge.  Cardiovascular: Normal rate, regular rhythm and normal heart sounds.   No murmur heard. Pulmonary/Chest: Effort normal and breath sounds normal. No respiratory distress. She has no wheezes. She has no rales. She exhibits no tenderness.  Musculoskeletal: She exhibits no edema.    Lymphadenopathy:    She has no cervical adenopathy.  Neurological: She is alert and oriented to person, place, and time.  Psychiatric: She has a normal mood and affect. Her behavior is normal. Judgment and thought content normal.  Nursing note and vitals reviewed.   BP 116/80 (BP Location: Left Arm, Cuff Size: Large)   Pulse 69   Temp 97.6 F (36.4 C) (Oral)   Resp 16   Ht 5\' 6"  (1.676 m)   Wt 282 lb (127.9 kg)   SpO2 96%   BMI 45.52 kg/m  Wt Readings from Last 3 Encounters:  11/30/16 282 lb (127.9 kg)  08/05/16 284 lb 6 oz (129 kg)  05/19/16 291 lb (132 kg)   BP Readings from Last 3 Encounters:  11/30/16 116/80  08/05/16 111/77  05/19/16 122/88     Immunization History  Administered Date(s) Administered  . Influenza Whole 03/29/2012  . Influenza,inj,Quad PF,36+ Mos 03/22/2013  . Tdap 03/31/2011    Health Maintenance  Topic Date Due  . MAMMOGRAM  03/16/2017 (Originally 08/02/1977)  . PAP SMEAR  03/16/2017 (Originally 07/05/2014)  . Hepatitis C Screening  03/16/2017 (Originally October 09, 1960)  . HIV Screening  03/16/2017 (Originally 08/02/1974)  . INFLUENZA VACCINE  01/20/2017  . COLONOSCOPY  10/07/2018  . TETANUS/TDAP  03/30/2021    Lab Results  Component Value Date   WBC 8.3 05/19/2016   HGB 14.5 05/19/2016   HCT 44.2 05/19/2016   PLT 239.0 05/19/2016   GLUCOSE 87 03/16/2016   CHOL 134 03/16/2016   TRIG 146.0 03/16/2016   HDL 50.20 03/16/2016   LDLCALC 54 03/16/2016   ALT 18 03/16/2016   AST 21 03/16/2016   NA 140 03/16/2016   K 4.1 03/16/2016   CL 103 03/16/2016   CREATININE 0.94 03/16/2016   BUN 17 03/16/2016   CO2 28 03/16/2016   TSH 1.42 03/16/2016    Lab Results  Component Value Date   TSH 1.42 03/16/2016   Lab Results  Component Value Date   WBC 8.3 05/19/2016   HGB 14.5 05/19/2016   HCT 44.2 05/19/2016   MCV 85.4 05/19/2016   PLT 239.0 05/19/2016   Lab Results  Component Value Date   NA 140 03/16/2016   K 4.1 03/16/2016   CO2 28  03/16/2016   GLUCOSE 87 03/16/2016   BUN 17 03/16/2016   CREATININE 0.94 03/16/2016   BILITOT 0.2 03/16/2016   ALKPHOS 86 03/16/2016   AST 21 03/16/2016   ALT 18 03/16/2016   PROT 7.4 03/16/2016   ALBUMIN 3.7 03/16/2016   CALCIUM 8.9 03/16/2016   GFR 65.32 03/16/2016   Lab Results  Component Value Date   CHOL 134 03/16/2016   Lab Results  Component Value Date   HDL 50.20 03/16/2016   Lab  Results  Component Value Date   LDLCALC 54 03/16/2016   Lab Results  Component Value Date   TRIG 146.0 03/16/2016   Lab Results  Component Value Date   CHOLHDL 3 03/16/2016   No results found for: HGBA1C       Assessment & Plan:   Problem List Items Addressed This Visit    None    Visit Diagnoses    Seasonal allergic rhinitis due to pollen    -  Primary   Cough       Relevant Medications   promethazine-dextromethorphan (PROMETHAZINE-DM) 6.25-15 MG/5ML syrup     restart the singulair and xyzal   Pt also has tessalon for day cough Phenergan dm for night time cough rto prn or call  I am having Ms. Scharf start on promethazine-dextromethorphan. I am also having her maintain her levocetirizine, Montelukast Sodium (SINGULAIR PO), meloxicam, carvedilol, carvedilol, and benzonatate.  Meds ordered this encounter  Medications  . promethazine-dextromethorphan (PROMETHAZINE-DM) 6.25-15 MG/5ML syrup    Sig: Take 5 mLs by mouth 4 (four) times daily as needed.    Dispense:  118 mL    Refill:  0    CMA served as scribe during this visit. History, Physical and Plan performed by medical provider. Documentation and orders reviewed and attested to.  Ann Held, DO

## 2016-11-30 NOTE — Patient Instructions (Signed)

## 2016-12-01 ENCOUNTER — Telehealth: Payer: Self-pay | Admitting: Family Medicine

## 2016-12-01 ENCOUNTER — Encounter: Payer: Self-pay | Admitting: Family Medicine

## 2016-12-01 NOTE — Telephone Encounter (Signed)
Patient is requesting a not efor her to be out until 12/03/16   [phone]

## 2016-12-01 NOTE — Telephone Encounter (Signed)
Pt needs note saying she was seen yesterday 11/30/16 and can return to work Thursday 12/03/16. Pt knows Etter Sjogren is out of the office tomorrow. Please call pt when ready.

## 2016-12-01 NOTE — Telephone Encounter (Signed)
I remember telling her if her throat was not better we would extend her note-- ok to do note

## 2016-12-01 NOTE — Telephone Encounter (Signed)
Letter completed Patient contacted to pickup at the front desk at her convenience.

## 2016-12-05 ENCOUNTER — Ambulatory Visit (INDEPENDENT_AMBULATORY_CARE_PROVIDER_SITE_OTHER): Payer: Commercial Managed Care - HMO | Admitting: Family Medicine

## 2016-12-05 ENCOUNTER — Encounter: Payer: Self-pay | Admitting: Family Medicine

## 2016-12-05 VITALS — BP 136/80 | HR 60 | Temp 98.4°F | Ht 66.0 in | Wt 280.0 lb

## 2016-12-05 DIAGNOSIS — J209 Acute bronchitis, unspecified: Secondary | ICD-10-CM

## 2016-12-05 MED ORDER — PREDNISONE 20 MG PO TABS: ORAL_TABLET | ORAL | 0 refills | Status: DC

## 2016-12-05 NOTE — Progress Notes (Signed)
PCP: Ann Held, DO  Subjective:  Kirsten Nelson is a 56 y.o. year old very pleasant female patient who presents with  symptoms including nasal congestion,  cough, chest congestion.   Patient has had a dry cough since the 10th. She also later developed nasal congestion, postnasal drip, runny nose. She was seen on 11/30/16 and thought to have seasonal allergies- she was advised to start promethazine-dextromethorphan (helps minimally) since tessalon was not helping very much. Also advised to restart her singulair and xyzal.   Today, she states she has had some wheeze at time. Dr. Etter Sjogren had mentioned considering prednisone. No fever. No shortness of breath. No nausea or vomiting since last visit- had once with a coughing fit  ROS-denies fever, NVD, tooth pain. Denies significant shortness of breath.   Pertinent Past Medical History-  Patient Active Problem List   Diagnosis Date Noted  . Calf pain 12/26/2014  . 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 HTN (hypertension) 03/30/2012  . Environmental allergies 03/30/2012    Medications- reviewed  Current Outpatient Prescriptions  Medication Sig Dispense Refill  . benzonatate (TESSALON) 100 MG capsule TAKE 1 CAPSULE (100 MG TOTAL) BY MOUTH 3 (THREE) TIMES DAILY AS NEEDED FOR COUGH. 21 capsule 0  . carvedilol (COREG) 12.5 MG tablet Take 1 tablet (12.5 mg total) by mouth 2 (two) times daily with a meal. 60 tablet 3  . carvedilol (COREG) 12.5 MG tablet TAKE 1 TABLET (12.5 MG TOTAL) BY MOUTH 2 (TWO) TIMES DAILY WITH A MEAL. 60 tablet 3  . levocetirizine (XYZAL) 5 MG tablet Take 5 mg by mouth daily as needed.     . meloxicam (MOBIC) 7.5 MG tablet Take 1 tablet (7.5 mg total) by mouth daily. 30 tablet 0  . Montelukast Sodium (SINGULAIR PO) Take 10 mg by mouth daily as needed.     . promethazine-dextromethorphan (PROMETHAZINE-DM) 6.25-15 MG/5ML syrup Take 5 mLs by mouth 4 (four) times daily as needed.  118 mL 0  . predniSONE (DELTASONE) 20 MG tablet Take 2 pills for 3 days, 1 pill for 4 days 10 tablet 0   No current facility-administered medications for this visit.     Objective: BP 136/80 (BP Location: Left Arm, Patient Position: Sitting, Cuff Size: Large)   Pulse 60   Temp 98.4 F (36.9 C) (Oral)   Ht 5\' 6"  (1.676 m)   Wt 280 lb (127 kg)   SpO2 97%   BMI 45.19 kg/m  Gen: NAD, resting comfortably HEENT: Turbinates erythematous with clear drainage, TM normal, pharynx mildly erythematous with no tonsilar exudate or edema, no sinus tenderness CV: RRR no murmurs rubs or gallops Lungs: Diffuse wheeze, rhonchi. Good air movement Ext: no edema Skin: warm, dry, no rash  Assessment/Plan:  Bronchitis History and exam today are suggestive of viral infection most likely due to bronchitis (I am concerned due to wheeze and rhonchi). Symptomatic treatment with: plain mucinex Discussed prednisone:we agreed to trial this to see if can at least reduce hersymptoms  We discussed that we did not find any infection that had higher probability of being bacterial such as pneumonia, strep throat, ear infection, bacterial sinusitis. We discussed signs that bacterial infection may have developed particularly fever or shortness of breath and reasons for follow up (symptoms worsen, last past expected time frame, new concerns arise).  Likely course of 2-6 weeks total.   Meds ordered this encounter  Medications  . predniSONE (DELTASONE) 20 MG tablet  Sig: Take 2 pills for 3 days, 1 pill for 4 days    Dispense:  10 tablet    Refill:  0    Garret Reddish, MD

## 2016-12-05 NOTE — Patient Instructions (Signed)
Bronchitis History and exam today are suggestive of viral infection most likely due to bronchitis (I am concerned due to wheeze and rhonchi). Symptomatic treatment with: plain mucinex Discussed prednisone:we agreed to trial this to see if can at least reduce hersymptoms  We discussed that we did not find any infection that had higher probability of being bacterial such as pneumonia, strep throat, ear infection, bacterial sinusitis. We discussed signs that bacterial infection may have developed particularly fever or shortness of breath and reasons for follow up (symptoms worsen, last past expected time frame, new concerns arise).  Likely course of 2-6 weeks total.   Meds ordered this encounter  Medications  . predniSONE (DELTASONE) 20 MG tablet    Sig: Take 2 pills for 3 days, 1 pill for 4 days    Dispense:  10 tablet    Refill:  0    Garret Reddish, MD

## 2016-12-14 ENCOUNTER — Ambulatory Visit (INDEPENDENT_AMBULATORY_CARE_PROVIDER_SITE_OTHER): Payer: Commercial Managed Care - HMO | Admitting: Family Medicine

## 2016-12-14 ENCOUNTER — Encounter: Payer: Self-pay | Admitting: Family Medicine

## 2016-12-14 ENCOUNTER — Ambulatory Visit (HOSPITAL_BASED_OUTPATIENT_CLINIC_OR_DEPARTMENT_OTHER)
Admission: RE | Admit: 2016-12-14 | Discharge: 2016-12-14 | Disposition: A | Discharge status: Home / Self Care | Payer: Commercial Managed Care - HMO | Source: Ambulatory Visit | Attending: Family Medicine | Admitting: Family Medicine

## 2016-12-14 VITALS — BP 114/70 | HR 91 | Temp 98.7°F | Resp 18 | Ht 66.0 in | Wt 280.2 lb

## 2016-12-14 DIAGNOSIS — R05 Cough: Secondary | ICD-10-CM | POA: Insufficient documentation

## 2016-12-14 DIAGNOSIS — J209 Acute bronchitis, unspecified: Secondary | ICD-10-CM

## 2016-12-14 MED ORDER — LEVOFLOXACIN 500 MG PO TABS: 500.0000 mg | ORAL_TABLET | Freq: Every day | ORAL | 0 refills | Status: DC

## 2016-12-14 MED ORDER — HYDROCOD POLST-CPM POLST ER 10-8 MG/5ML PO SUER: 5.0000 mL | Freq: Every evening | ORAL | Status: DC | PRN

## 2016-12-14 MED ORDER — HYDROCOD POLST-CPM POLST ER 10-8 MG/5ML PO SUER: 5.0000 mL | Freq: Every evening | ORAL | 0 refills | Status: DC | PRN

## 2016-12-14 NOTE — Patient Instructions (Signed)

## 2016-12-14 NOTE — Progress Notes (Signed)
Patient ID: ICEL CASTLES, female   DOB: 12/18/1960, 56 y.o.   MRN: [id number]     Subjective:  I acted as a Education administrator for Dr. Carollee Herter.  [name], [place]   Patient ID: AVARI NEVARES, female    DOB: 1961/04/19, 56 y.o.   MRN: [id number]  Chief Complaint  Patient presents with  . Cough    Cough  Episode onset: 3 weeks ago. The problem has been unchanged. The cough is productive of brown sputum. Associated symptoms include nasal congestion, postnasal drip, rhinorrhea, shortness of breath and wheezing. Pertinent negatives include no chest pain, chills, ear congestion, ear pain, fever, headaches, heartburn, hemoptysis, myalgias, rash, sore throat, sweats or weight loss. She has tried oral steroids and prescription cough suppressant for the symptoms. The treatment provided mild relief.    Patient is in today for follow up cough.  No better.  Patient Care Team: Carollee Herter, Alferd Apa, DO as PCP - General (Family Medicine)   Past Medical History:  Diagnosis Date  . Allergy   . Environmental allergies   . HTN (hypertension)     Past Surgical History:  Procedure Laterality Date  . TONSILLECTOMY      Family History  Problem Relation Age of Onset  . Hypertension Father   . Leukemia Father   . Lymphoma Father   . Cancer Father   . Hypertension Mother   . Colon cancer Neg Hx   . Esophageal cancer Neg Hx   . Rectal cancer Neg Hx   . Stomach cancer Neg Hx     Social History   Social History  . Marital status: Single    Spouse name: N/A  . Number of children: N/A  . Years of education: N/A   Occupational History  . collections Unemployed   Social History Main Topics  . Smoking status: Never Smoker  . Smokeless tobacco: Never Used  . Alcohol use No  . Drug use: No  . Sexual activity: Not Currently    Partners: Male   Other Topics Concern  . Not on file   Social History Narrative   Exercise-- no    Outpatient Medications Prior to Visit  Medication Sig Dispense  Refill  . carvedilol (COREG) 12.5 MG tablet Take 1 tablet (12.5 mg total) by mouth 2 (two) times daily with a meal. 60 tablet 3  . carvedilol (COREG) 12.5 MG tablet TAKE 1 TABLET (12.5 MG TOTAL) BY MOUTH 2 (TWO) TIMES DAILY WITH A MEAL. 60 tablet 3  . levocetirizine (XYZAL) 5 MG tablet Take 5 mg by mouth daily as needed.     . meloxicam (MOBIC) 7.5 MG tablet Take 1 tablet (7.5 mg total) by mouth daily. 30 tablet 0  . Montelukast Sodium (SINGULAIR PO) Take 10 mg by mouth daily as needed.     . promethazine-dextromethorphan (PROMETHAZINE-DM) 6.25-15 MG/5ML syrup Take 5 mLs by mouth 4 (four) times daily as needed. 118 mL 0  . benzonatate (TESSALON) 100 MG capsule TAKE 1 CAPSULE (100 MG TOTAL) BY MOUTH 3 (THREE) TIMES DAILY AS NEEDED FOR COUGH. 21 capsule 0  . predniSONE (DELTASONE) 20 MG tablet Take 2 pills for 3 days, 1 pill for 4 days 10 tablet 0   No facility-administered medications prior to visit.     Allergies  Allergen Reactions  . Ace Inhibitors Cough    Review of Systems  Constitutional: Negative for chills, fever and weight loss.  HENT: Positive for postnasal drip and rhinorrhea. Negative for ear pain and  sore throat.   Respiratory: Positive for cough, shortness of breath and wheezing. Negative for hemoptysis.   Cardiovascular: Negative for chest pain.  Gastrointestinal: Negative for heartburn.  Musculoskeletal: Negative for myalgias.  Skin: Negative for rash.  Neurological: Negative for headaches.       Objective:    Physical Exam  Constitutional: She is oriented to person, place, and time. She appears well-developed and well-nourished.  HENT:  Right Ear: External ear normal.  Left Ear: External ear normal.  + PND + errythema  Eyes: Conjunctivae are normal. Right eye exhibits no discharge. Left eye exhibits no discharge.  Cardiovascular: Normal rate, regular rhythm and normal heart sounds.   No murmur heard. Pulmonary/Chest: Effort normal. No respiratory distress.  She has decreased breath sounds. She has wheezes. She has no rales. She exhibits no tenderness.  Musculoskeletal: She exhibits no edema.  Lymphadenopathy:    She has cervical adenopathy.  Neurological: She is alert and oriented to person, place, and time.  Nursing note and vitals reviewed.   BP 114/70 (BP Location: Left Arm, Cuff Size: Large)   Pulse 91   Temp 98.7 F (37.1 C) (Oral)   Resp 18   Ht 5\' 6"  (1.676 m)   Wt 280 lb 3.2 oz (127.1 kg)   SpO2 94%   BMI 45.23 kg/m  Wt Readings from Last 3 Encounters:  12/14/16 280 lb 3.2 oz (127.1 kg)  12/05/16 280 lb (127 kg)  11/30/16 282 lb (127.9 kg)   BP Readings from Last 3 Encounters:  12/14/16 114/70  12/05/16 136/80  11/30/16 116/80     Immunization History  Administered Date(s) Administered  . Influenza Whole 03/29/2012  . Influenza,inj,Quad PF,36+ Mos 03/22/2013  . Tdap 03/31/2011    Health Maintenance  Topic Date Due  . MAMMOGRAM  03/16/2017 (Originally 08/02/1977)  . PAP SMEAR  03/16/2017 (Originally 07/05/2014)  . Hepatitis C Screening  03/16/2017 (Originally 1961/02/03)  . HIV Screening  03/16/2017 (Originally 08/02/1974)  . INFLUENZA VACCINE  01/20/2017  . COLONOSCOPY  10/07/2018  . TETANUS/TDAP  03/30/2021    Lab Results  Component Value Date   WBC 8.3 05/19/2016   HGB 14.5 05/19/2016   HCT 44.2 05/19/2016   PLT 239.0 05/19/2016   GLUCOSE 87 03/16/2016   CHOL 134 03/16/2016   TRIG 146.0 03/16/2016   HDL 50.20 03/16/2016   LDLCALC 54 03/16/2016   ALT 18 03/16/2016   AST 21 03/16/2016   NA 140 03/16/2016   K 4.1 03/16/2016   CL 103 03/16/2016   CREATININE 0.94 03/16/2016   BUN 17 03/16/2016   CO2 28 03/16/2016   TSH 1.42 03/16/2016    Lab Results  Component Value Date   TSH 1.42 03/16/2016   Lab Results  Component Value Date   WBC 8.3 05/19/2016   HGB 14.5 05/19/2016   HCT 44.2 05/19/2016   MCV 85.4 05/19/2016   PLT 239.0 05/19/2016   Lab Results  Component Value Date   NA 140  03/16/2016   K 4.1 03/16/2016   CO2 28 03/16/2016   GLUCOSE 87 03/16/2016   BUN 17 03/16/2016   CREATININE 0.94 03/16/2016   BILITOT 0.2 03/16/2016   ALKPHOS 86 03/16/2016   AST 21 03/16/2016   ALT 18 03/16/2016   PROT 7.4 03/16/2016   ALBUMIN 3.7 03/16/2016   CALCIUM 8.9 03/16/2016   GFR 65.32 03/16/2016   Lab Results  Component Value Date   CHOL 134 03/16/2016   Lab Results  Component Value Date  HDL 50.20 03/16/2016   Lab Results  Component Value Date   LDLCALC 54 03/16/2016   Lab Results  Component Value Date   TRIG 146.0 03/16/2016   Lab Results  Component Value Date   CHOLHDL 3 03/16/2016   No results found for: HGBA1C       Assessment & Plan:   Problem List Items Addressed This Visit    None    Visit Diagnoses    Acute bronchitis, unspecified organism    -  Primary   Relevant Medications   levofloxacin (LEVAQUIN) 500 MG tablet   chlorpheniramine-HYDROcodone (TUSSIONEX PENNKINETIC ER) 10-8 MG/5ML SUER   Other Relevant Orders   DG Chest 2 View (Completed)      I have discontinued Ms. Coppess's benzonatate, promethazine-dextromethorphan, and predniSONE. I am also having her start on levofloxacin and chlorpheniramine-HYDROcodone. Additionally, I am having her maintain her levocetirizine, Montelukast Sodium (SINGULAIR PO), meloxicam, carvedilol, and carvedilol.  Meds ordered this encounter  Medications  . levofloxacin (LEVAQUIN) 500 MG tablet    Sig: Take 1 tablet (500 mg total) by mouth daily.    Dispense:  10 tablet    Refill:  0  . DISCONTD: chlorpheniramine-HYDROcodone (TUSSIONEX) 10-8 MG/5ML suspension 5 mL  . chlorpheniramine-HYDROcodone (TUSSIONEX PENNKINETIC ER) 10-8 MG/5ML SUER    Sig: Take 5 mLs by mouth at bedtime as needed for cough.    Dispense:  140 mL    Refill:  0    CMA served as scribe during this visit. History, Physical and Plan performed by medical provider. Documentation and orders reviewed and attested to.  Ann Held, DO

## 2016-12-18 ENCOUNTER — Ambulatory Visit (INDEPENDENT_AMBULATORY_CARE_PROVIDER_SITE_OTHER): Payer: Commercial Managed Care - HMO | Admitting: Family Medicine

## 2016-12-18 ENCOUNTER — Encounter: Payer: Self-pay | Admitting: Family Medicine

## 2016-12-18 VITALS — BP 110/86 | HR 73 | Temp 98.2°F | Resp 18 | Ht 66.0 in | Wt 277.0 lb

## 2016-12-18 DIAGNOSIS — R05 Cough: Secondary | ICD-10-CM | POA: Diagnosis not present

## 2016-12-18 DIAGNOSIS — R059 Cough, unspecified: Secondary | ICD-10-CM

## 2016-12-18 MED ORDER — ALBUTEROL SULFATE 108 (90 BASE) MCG/ACT IN AEPB: 2.0000 | INHALATION_SPRAY | Freq: Four times a day (QID) | RESPIRATORY_TRACT | 1 refills | Status: DC

## 2016-12-18 MED ORDER — RANITIDINE HCL 150 MG PO TABS: 150.0000 mg | ORAL_TABLET | Freq: Two times a day (BID) | ORAL | 5 refills | Status: DC

## 2016-12-18 MED FILL — PROAIR RESPICLICK INHAL PWD: 108 (90 BAS | 25 days supply | Qty: 1 | Fill #0

## 2016-12-18 MED FILL — SM ACID REDUCER 150 MG TAB: 150 MG | 33 days supply | Qty: 65 | Fill #0

## 2016-12-18 NOTE — Progress Notes (Signed)
Patient ID: Kirsten Nelson, female   DOB: July 05, 1960, 56 y.o.   MRN: [id number]     Subjective:  I acted as a Education administrator for Dr. Carollee Herter.  [name], [place]   Patient ID: Kirsten Nelson, female    DOB: [birth year], 56 y.o.   MRN: [id number]  Chief Complaint  Patient presents with  . Cough    no better    HPI  Patient is in today for follow up cough.  Her cough is no better with the strong cough syrup.     Patient Care Team: Carollee Herter, Alferd Apa, DO as PCP - General (Family Medicine)   Past Medical History:  Diagnosis Date  . Allergy   . Environmental allergies   . HTN (hypertension)     Past Surgical History:  Procedure Laterality Date  . TONSILLECTOMY      Family History  Problem Relation Age of Onset  . Hypertension Father   . Leukemia Father   . Lymphoma Father   . Cancer Father   . Hypertension Mother   . Colon cancer Neg Hx   . Esophageal cancer Neg Hx   . Rectal cancer Neg Hx   . Stomach cancer Neg Hx     Social History   Social History  . Marital status: Single    Spouse name: N/A  . Number of children: N/A  . Years of education: N/A   Occupational History  . collections Unemployed   Social History Main Topics  . Smoking status: Never Smoker  . Smokeless tobacco: Never Used  . Alcohol use No  . Drug use: No  . Sexual activity: Not Currently    Partners: Male   Other Topics Concern  . Not on file   Social History Narrative   Exercise-- no    Outpatient Medications Prior to Visit  Medication Sig Dispense Refill  . carvedilol (COREG) 12.5 MG tablet Take 1 tablet (12.5 mg total) by mouth 2 (two) times daily with a meal. 60 tablet 3  . carvedilol (COREG) 12.5 MG tablet TAKE 1 TABLET (12.5 MG TOTAL) BY MOUTH 2 (TWO) TIMES DAILY WITH A MEAL. 60 tablet 3  . chlorpheniramine-HYDROcodone (TUSSIONEX PENNKINETIC ER) 10-8 MG/5ML SUER Take 5 mLs by mouth at bedtime as needed for cough. 140 mL 0  . levocetirizine (XYZAL) 5 MG tablet Take 5 mg by mouth  daily as needed.     Marland Kitchen levofloxacin (LEVAQUIN) 500 MG tablet Take 1 tablet (500 mg total) by mouth daily. 10 tablet 0  . meloxicam (MOBIC) 7.5 MG tablet Take 1 tablet (7.5 mg total) by mouth daily. 30 tablet 0  . Montelukast Sodium (SINGULAIR PO) Take 10 mg by mouth daily as needed.      No facility-administered medications prior to visit.     Allergies  Allergen Reactions  . Ace Inhibitors Cough    Review of Systems  Constitutional: Negative for fever and malaise/fatigue.  HENT: Negative for congestion.   Eyes: Negative for blurred vision.  Respiratory: Positive for cough. Negative for shortness of breath.   Cardiovascular: Negative for chest pain, palpitations and leg swelling.  Gastrointestinal: Negative for vomiting.  Musculoskeletal: Negative for back pain.  Skin: Negative for rash.  Neurological: Negative for loss of consciousness and headaches.       Objective:    Physical Exam  Constitutional: She is oriented to person, place, and time. She appears well-developed and well-nourished.  HENT:  Head: Normocephalic and atraumatic.  Eyes: Conjunctivae and EOM are  normal.  Neck: Normal range of motion. Neck supple. No JVD present. Carotid bruit is not present. No thyromegaly present.  Cardiovascular: Normal rate, regular rhythm and normal heart sounds.   No murmur heard. Pulmonary/Chest: Effort normal and breath sounds normal. No respiratory distress. She has no wheezes. She has no rales. She exhibits no tenderness.  Musculoskeletal: She exhibits no edema.  Neurological: She is alert and oriented to person, place, and time.  Psychiatric: She has a normal mood and affect.  Nursing note and vitals reviewed.   BP 110/86 (BP Location: Right Arm, Cuff Size: Large)   Pulse 73   Temp 98.2 F (36.8 C) (Oral)   Resp 18   Ht 5\' 6"  (1.676 m)   Wt 277 lb (125.6 kg)   SpO2 93%   BMI 44.71 kg/m  Wt Readings from Last 3 Encounters:  12/18/16 277 lb (125.6 kg)  12/14/16 280 lb  3.2 oz (127.1 kg)  12/05/16 280 lb (127 kg)   BP Readings from Last 3 Encounters:  12/18/16 110/86  12/14/16 114/70  12/05/16 136/80     Immunization History  Administered Date(s) Administered  . Influenza Whole 03/29/2012  . Influenza,inj,Quad PF,36+ Mos 03/22/2013  . Tdap 03/31/2011    Health Maintenance  Topic Date Due  . MAMMOGRAM  03/16/2017 (Originally 08/02/1977)  . PAP SMEAR  03/16/2017 (Originally 07/05/2014)  . Hepatitis C Screening  03/16/2017 (Originally 1961-04-24)  . HIV Screening  03/16/2017 (Originally 08/02/1974)  . INFLUENZA VACCINE  01/20/2017  . COLONOSCOPY  10/07/2018  . TETANUS/TDAP  03/30/2021    Lab Results  Component Value Date   WBC 8.3 05/19/2016   HGB 14.5 05/19/2016   HCT 44.2 05/19/2016   PLT 239.0 05/19/2016   GLUCOSE 87 03/16/2016   CHOL 134 03/16/2016   TRIG 146.0 03/16/2016   HDL 50.20 03/16/2016   LDLCALC 54 03/16/2016   ALT 18 03/16/2016   AST 21 03/16/2016   NA 140 03/16/2016   K 4.1 03/16/2016   CL 103 03/16/2016   CREATININE 0.94 03/16/2016   BUN 17 03/16/2016   CO2 28 03/16/2016   TSH 1.42 03/16/2016    Lab Results  Component Value Date   TSH 1.42 03/16/2016   Lab Results  Component Value Date   WBC 8.3 05/19/2016   HGB 14.5 05/19/2016   HCT 44.2 05/19/2016   MCV 85.4 05/19/2016   PLT 239.0 05/19/2016   Lab Results  Component Value Date   NA 140 03/16/2016   K 4.1 03/16/2016   CO2 28 03/16/2016   GLUCOSE 87 03/16/2016   BUN 17 03/16/2016   CREATININE 0.94 03/16/2016   BILITOT 0.2 03/16/2016   ALKPHOS 86 03/16/2016   AST 21 03/16/2016   ALT 18 03/16/2016   PROT 7.4 03/16/2016   ALBUMIN 3.7 03/16/2016   CALCIUM 8.9 03/16/2016   GFR 65.32 03/16/2016   Lab Results  Component Value Date   CHOL 134 03/16/2016   Lab Results  Component Value Date   HDL 50.20 03/16/2016   Lab Results  Component Value Date   LDLCALC 54 03/16/2016   Lab Results  Component Value Date   TRIG 146.0 03/16/2016   Lab  Results  Component Value Date   CHOLHDL 3 03/16/2016   No results found for: HGBA1C       Assessment & Plan:   Problem List Items Addressed This Visit    None    Visit Diagnoses    Cough    -  Primary  Relevant Medications   ranitidine (ZANTAC) 150 MG tablet      I am having Ms. Wehner start on ranitidine. I am also having her maintain her levocetirizine, Montelukast Sodium (SINGULAIR PO), meloxicam, carvedilol, carvedilol, levofloxacin, and chlorpheniramine-HYDROcodone.  Meds ordered this encounter  Medications  . ranitidine (ZANTAC) 150 MG tablet    Sig: Take 1 tablet (150 mg total) by mouth 2 (two) times daily.    Dispense:  60 tablet    Refill:  5    CMA served as scribe during this visit. History, Physical and Plan performed by medical provider. Documentation and orders reviewed and attested to.  Ann Held, DO

## 2016-12-18 NOTE — Patient Instructions (Signed)
Cough, Adult Coughing is a reflex that clears your throat and your airways. Coughing helps to heal and protect your lungs. It is normal to cough occasionally, but a cough that happens with other symptoms or lasts a long time may be a sign of a condition that needs treatment. A cough may last only 2-3 weeks (acute), or it may last longer than 8 weeks (chronic). What are the causes? Coughing is commonly caused by:  Breathing in substances that irritate your lungs.  A viral or bacterial respiratory infection.  Allergies.  Asthma.  Postnasal drip.  Smoking.  Acid backing up from the stomach into the esophagus (gastroesophageal reflux).  Certain medicines.  Chronic lung problems, including COPD (or rarely, lung cancer).  Other medical conditions such as heart failure.  Follow these instructions at home: Pay attention to any changes in your symptoms. Take these actions to help with your discomfort:  Take medicines only as told by your health care provider. ? If you were prescribed an antibiotic medicine, take it as told by your health care provider. Do not stop taking the antibiotic even if you start to feel better. ? Talk with your health care provider before you take a cough suppressant medicine.  Drink enough fluid to keep your urine clear or pale yellow.  If the air is dry, use a cold steam vaporizer or humidifier in your bedroom or your home to help loosen secretions.  Avoid anything that causes you to cough at work or at home.  If your cough is worse at night, try sleeping in a semi-upright position.  Avoid cigarette smoke. If you smoke, quit smoking. If you need help quitting, ask your health care provider.  Avoid caffeine.  Avoid alcohol.  Rest as needed.  Contact a health care provider if:  You have new symptoms.  You cough up pus.  Your cough does not get better after 2-3 weeks, or your cough gets worse.  You cannot control your cough with suppressant  medicines and you are losing sleep.  You develop pain that is getting worse or pain that is not controlled with pain medicines.  You have a fever.  You have unexplained weight loss.  You have night sweats. Get help right away if:  You cough up blood.  You have difficulty breathing.  Your heartbeat is very fast. This information is not intended to replace advice given to you by your health care provider. Make sure you discuss any questions you have with your health care provider. Document Released: 12/05/2010 Document Revised: 11/14/2015 Document Reviewed: 08/15/2014 Elsevier Interactive Patient Education  2017 [employer]  

## 2016-12-22 DIAGNOSIS — R05 Cough: Secondary | ICD-10-CM | POA: Diagnosis not present

## 2016-12-22 DIAGNOSIS — J209 Acute bronchitis, unspecified: Secondary | ICD-10-CM | POA: Diagnosis not present

## 2016-12-22 DIAGNOSIS — J3081 Allergic rhinitis due to animal (cat) (dog) hair and dander: Secondary | ICD-10-CM | POA: Diagnosis not present

## 2016-12-22 DIAGNOSIS — J301 Allergic rhinitis due to pollen: Secondary | ICD-10-CM | POA: Diagnosis not present

## 2017-01-05 ENCOUNTER — Telehealth: Payer: Self-pay | Admitting: Family Medicine

## 2017-01-05 NOTE — Telephone Encounter (Signed)
Received FMLA paperwork from [name], will work on completing tomorrow/SLS 07/17

## 2017-01-05 NOTE — Telephone Encounter (Signed)
Pt dropped off FMLA paperwork to be filled out. (Documents in yellow big envelope). Pt would like to have document fax tp [phone] when ready. Document put at front office tray.

## 2017-01-06 NOTE — Telephone Encounter (Signed)
Completed FMLA paperwork as much as possible, attached OV notes; forwarded to provider/SLS 07/18

## 2017-07-07 DIAGNOSIS — J301 Allergic rhinitis due to pollen: Secondary | ICD-10-CM | POA: Diagnosis not present

## 2017-07-07 DIAGNOSIS — R05 Cough: Secondary | ICD-10-CM | POA: Diagnosis not present

## 2017-07-07 DIAGNOSIS — J3081 Allergic rhinitis due to animal (cat) (dog) hair and dander: Secondary | ICD-10-CM | POA: Diagnosis not present

## 2017-07-07 DIAGNOSIS — J3089 Other allergic rhinitis: Secondary | ICD-10-CM | POA: Diagnosis not present

## 2017-08-05 DIAGNOSIS — J301 Allergic rhinitis due to pollen: Secondary | ICD-10-CM | POA: Diagnosis not present

## 2017-08-05 DIAGNOSIS — J3089 Other allergic rhinitis: Secondary | ICD-10-CM | POA: Diagnosis not present

## 2017-08-05 DIAGNOSIS — R05 Cough: Secondary | ICD-10-CM | POA: Diagnosis not present

## 2017-08-05 DIAGNOSIS — J3081 Allergic rhinitis due to animal (cat) (dog) hair and dander: Secondary | ICD-10-CM | POA: Diagnosis not present

## 2018-03-02 IMAGING — DX DG CHEST 2V
2 series · 2 of 2 positions shown · non-contrast
Comparison: None.

CLINICAL DATA: Chronic cough, worse in past 3 weeks.  Pneumonia?

EXAM:
CHEST  2 VIEW

[chest pa]
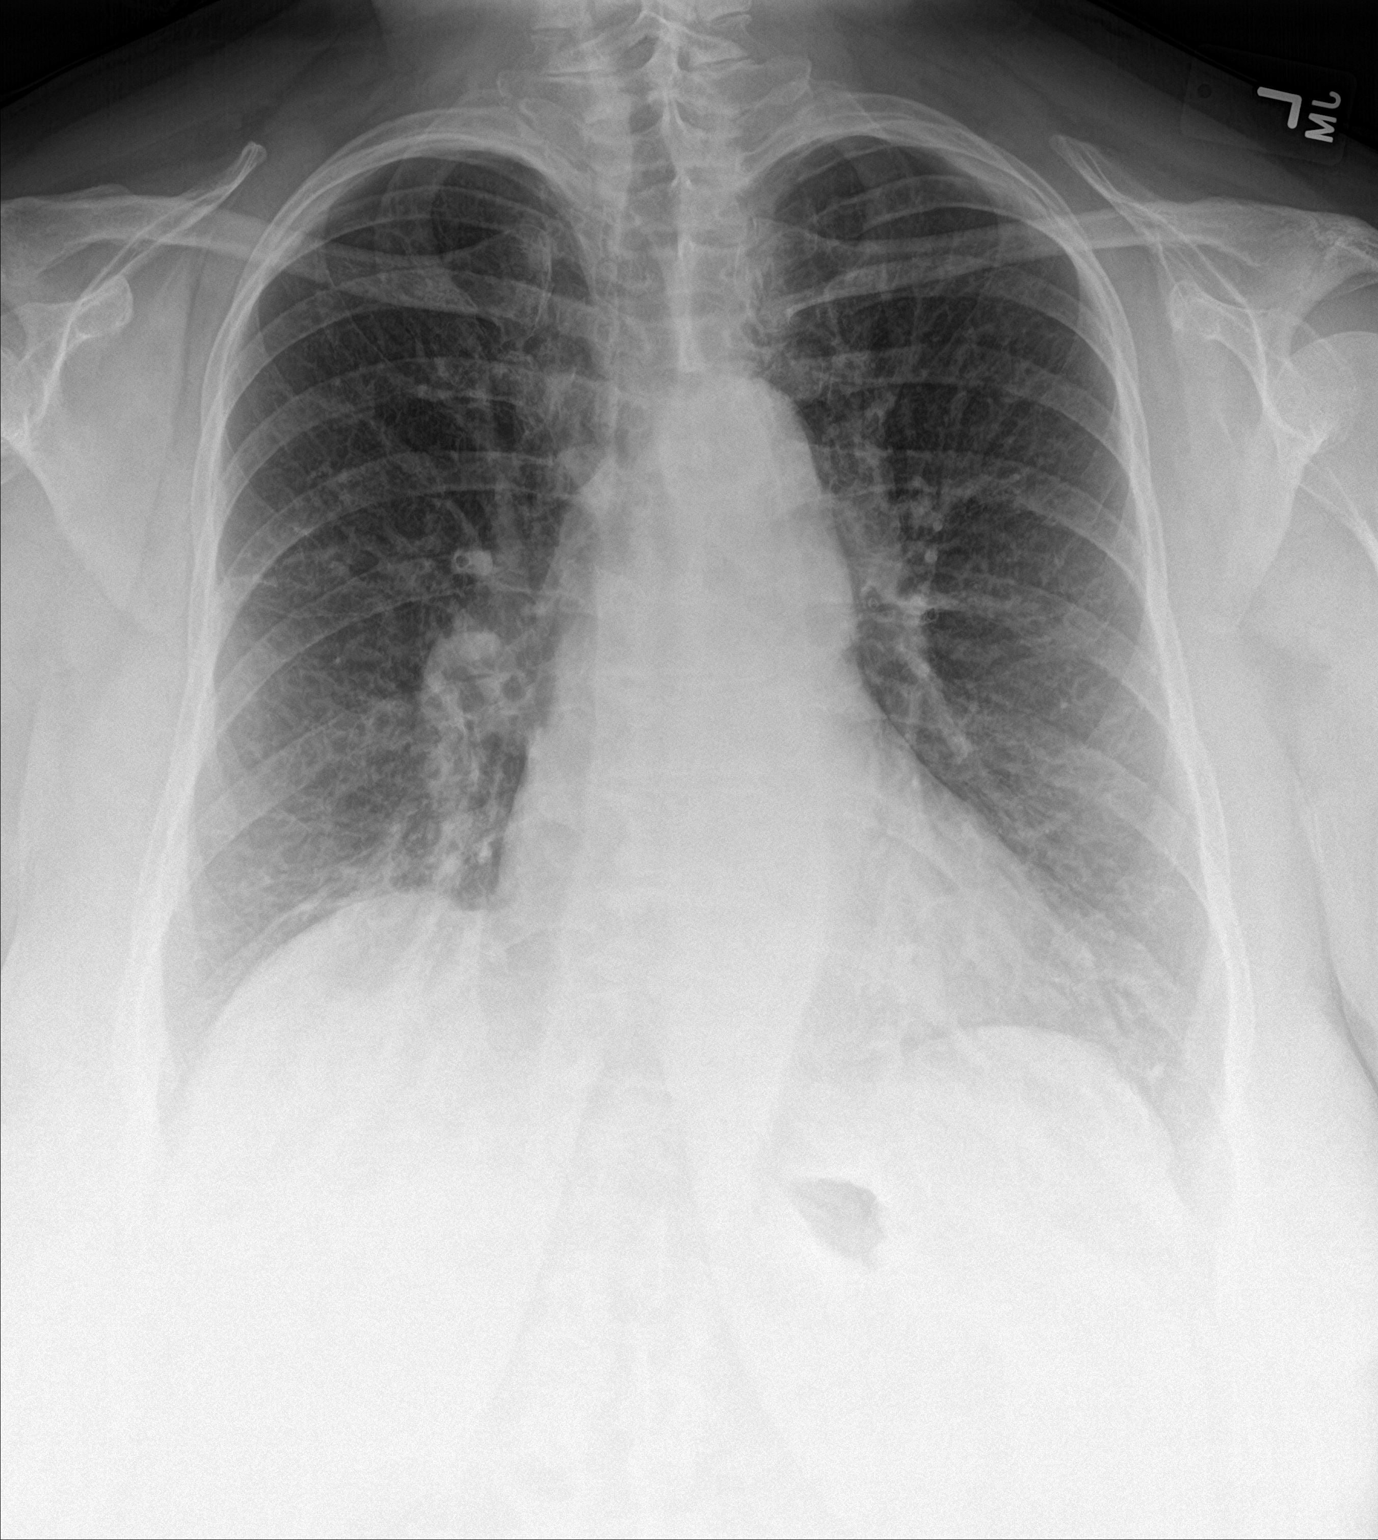

[chest lat]
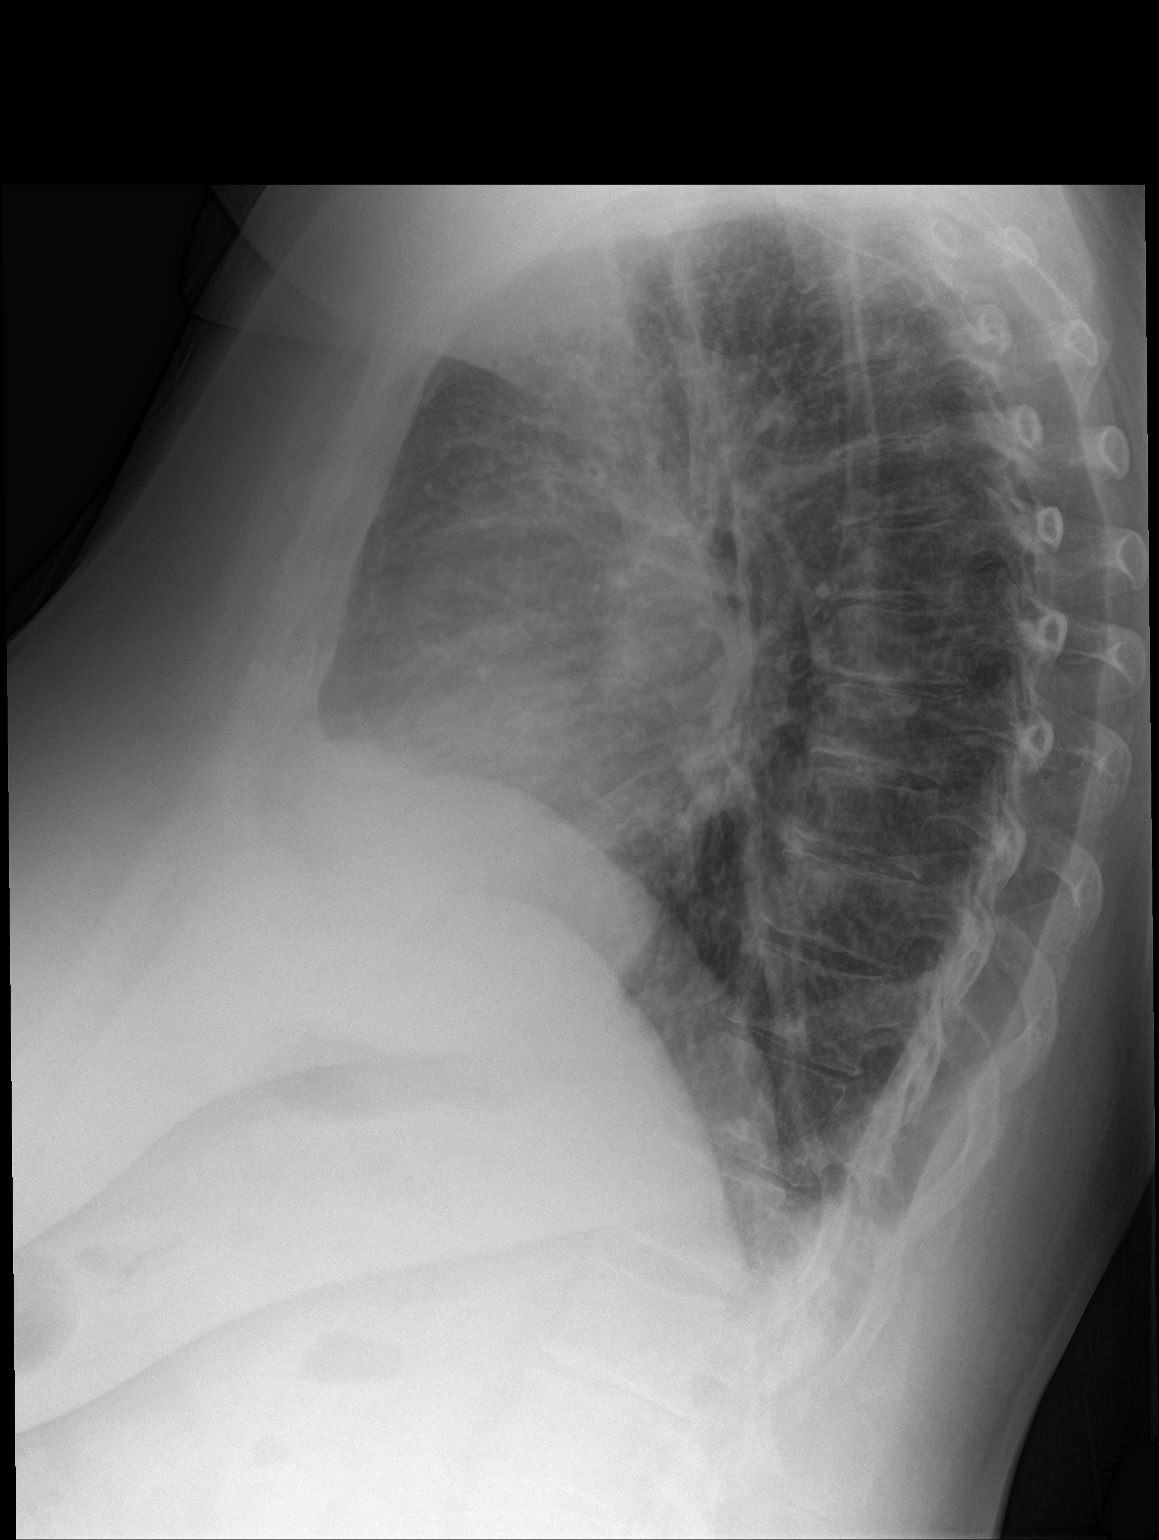

[2 of 2 positions shown; findings below may reference images not displayed]

FINDINGS: Heart size is upper normal. Overall cardiomediastinal silhouette is
within normal limits.

Lungs are clear.  No pleural effusion or pneumothorax.

Slight elevation of the right hemidiaphragm. Osseous structures
about the chest are unremarkable.
IMPRESSION: No active cardiopulmonary disease. No evidence of pneumonia or
pulmonary edema.

## 2018-03-08 ENCOUNTER — Encounter: Payer: Self-pay | Admitting: Family Medicine

## 2018-03-08 ENCOUNTER — Ambulatory Visit: Payer: 59 | Admitting: Family Medicine

## 2018-03-08 VITALS — BP 142/92 | HR 75 | Temp 98.4°F | Resp 16 | Ht 66.0 in | Wt 297.6 lb

## 2018-03-08 DIAGNOSIS — I10 Essential (primary) hypertension: Secondary | ICD-10-CM

## 2018-03-08 DIAGNOSIS — Z6841 Body Mass Index (BMI) 40.0 and over, adult: Secondary | ICD-10-CM | POA: Diagnosis not present

## 2018-03-08 MED ORDER — CARVEDILOL 12.5 MG PO TABS: 12.5000 mg | ORAL_TABLET | Freq: Two times a day (BID) | ORAL | 1 refills | Status: DC

## 2018-03-08 NOTE — Progress Notes (Signed)
Patient ID: Kirsten Nelson, female    DOB: 11-Apr-1961  Age: 57 y.o. MRN: [id number]    Subjective:  Subjective  HPI Kirsten Nelson presents for f/u bp.  No complaints   She has not been taking her bp meds regularly  Review of Systems  Constitutional: Negative for activity change, appetite change, fatigue and unexpected weight change.  Respiratory: Negative for cough and shortness of breath.   Cardiovascular: Negative for chest pain and palpitations.  Psychiatric/Behavioral: Negative for behavioral problems and dysphoric mood. The patient is not nervous/anxious.     History Past Medical History:  Diagnosis Date  . Allergy   . Environmental allergies   . HTN (hypertension)     She has a past surgical history that includes Tonsillectomy.   Her family history includes Cancer in her father and mother; Hypertension in her father and mother; Leukemia in her father; Lymphoma in her father.She reports that she has never smoked. She has never used smokeless tobacco. She reports that she does not drink alcohol or use drugs.  Current Outpatient Medications on File Prior to Visit  Medication Sig Dispense Refill  . BREO ELLIPTA 100-25 MCG/INH AEPB Take 1 puff by mouth daily.  2  . levocetirizine (XYZAL) 5 MG tablet Take 5 mg by mouth daily as needed.     . Montelukast Sodium (SINGULAIR PO) Take 10 mg by mouth daily as needed.      No current facility-administered medications on file prior to visit.      Objective:  Objective  Physical Exam  Constitutional: She is oriented to person, place, and time. She appears well-developed and well-nourished.  HENT:  Head: Normocephalic and atraumatic.  Eyes: Conjunctivae and EOM are normal.  Neck: Normal range of motion. Neck supple. No JVD present. Carotid bruit is not present. No thyromegaly present.  Cardiovascular: Normal rate, regular rhythm and normal heart sounds.  No murmur heard. Pulmonary/Chest: Effort normal and breath sounds normal. No  respiratory distress. She has no wheezes. She has no rales. She exhibits no tenderness.  Musculoskeletal: She exhibits no edema.  Neurological: She is alert and oriented to person, place, and time.  Psychiatric: She has a normal mood and affect.  Nursing note and vitals reviewed.  BP (!) 142/92   Pulse 75   Temp 98.4 F (36.9 C) (Oral)   Resp 16   Ht 5\' 6"  (1.676 m)   Wt 297 lb 9.6 oz (135 kg)   SpO2 94%   BMI 48.03 kg/m  Wt Readings from Last 3 Encounters:  03/08/18 297 lb 9.6 oz (135 kg)  12/18/16 277 lb (125.6 kg)  12/14/16 280 lb 3.2 oz (127.1 kg)     Lab Results  Component Value Date   WBC 8.3 05/19/2016   HGB 14.5 05/19/2016   HCT 44.2 05/19/2016   PLT 239.0 05/19/2016   GLUCOSE 87 03/16/2016   CHOL 134 03/16/2016   TRIG 146.0 03/16/2016   HDL 50.20 03/16/2016   LDLCALC 54 03/16/2016   ALT 18 03/16/2016   AST 21 03/16/2016   NA 140 03/16/2016   K 4.1 03/16/2016   CL 103 03/16/2016   CREATININE 0.94 03/16/2016   BUN 17 03/16/2016   CO2 28 03/16/2016   TSH 1.42 03/16/2016    Dg Chest 2 View  Result Date: 12/14/2016 CLINICAL DATA:  Chronic cough, worse in past 3 weeks.  Pneumonia? EXAM: CHEST  2 VIEW COMPARISON:  None. FINDINGS: Heart size is upper normal. Overall cardiomediastinal silhouette is  within normal limits. Lungs are clear.  No pleural effusion or pneumothorax. Slight elevation of the right hemidiaphragm. Osseous structures about the chest are unremarkable. IMPRESSION: No active cardiopulmonary disease. No evidence of pneumonia or pulmonary edema. Electronically Signed   By: Franki Cabot M.D.   On: 12/14/2016 10:53     Assessment & Plan:  Plan  I have discontinued [name]'s meloxicam, levofloxacin, chlorpheniramine-HYDROcodone, ranitidine, and Albuterol Sulfate. I am also having her maintain her levocetirizine, Montelukast Sodium (SINGULAIR PO), BREO ELLIPTA, and carvedilol.  Meds ordered this encounter  Medications  . carvedilol (COREG)  12.5 MG tablet    Sig: Take 1 tablet (12.5 mg total) by mouth 2 (two) times daily with a meal.    Dispense:  180 tablet    Refill:  1    Problem List Items Addressed This Visit      Unprioritized   HTN (hypertension)    Poorly controlled will alter medications, encouraged DASH diet, minimize caffeine and obtain adequate sleep. Report concerning symptoms and follow up as directed and as needed      Relevant Medications   carvedilol (COREG) 12.5 MG tablet   Other Relevant Orders   Comprehensive metabolic panel   Lipid panel   Amb Ref to Medical Weight Management    Other Visit Diagnoses    Class 3 severe obesity due to excess calories with body mass index (BMI) of 45.0 to 49.9 in adult, unspecified whether serious comorbidity present ([place])    -  Primary   Relevant Orders   Amb Ref to Medical Weight Management      Follow-up: Return in about 2 weeks (around 03/22/2018), or if symptoms worsen or fail to improve, for bp check.  Ann Held, DO

## 2018-03-08 NOTE — Patient Instructions (Signed)
DASH Eating Plan DASH stands for "Dietary Approaches to Stop Hypertension." The DASH eating plan is a healthy eating plan that has been shown to reduce high blood pressure (hypertension). It may also reduce your risk for type 2 diabetes, heart disease, and stroke. The DASH eating plan may also help with weight loss. What are tips for following this plan? General guidelines  Avoid eating more than 2,300 mg (milligrams) of salt (sodium) a day. If you have hypertension, you may need to reduce your sodium intake to 1,500 mg a day.  Limit alcohol intake to no more than 1 drink a day for nonpregnant women and 2 drinks a day for men. One drink equals 12 oz of beer, 5 oz of wine, or 1 oz of hard liquor.  Work with your health care provider to maintain a healthy body weight or to lose weight. Ask what an ideal weight is for you.  Get at least 30 minutes of exercise that causes your heart to beat faster (aerobic exercise) most days of the week. Activities may include walking, swimming, or biking.  Work with your health care provider or diet and nutrition specialist (dietitian) to adjust your eating plan to your individual calorie needs. Reading food labels  Check food labels for the amount of sodium per serving. Choose foods with less than 5 percent of the Daily Value of sodium. Generally, foods with less than 300 mg of sodium per serving fit into this eating plan.  To find whole grains, look for the word "whole" as the first word in the ingredient list. Shopping  Buy products labeled as "low-sodium" or "no salt added."  Buy fresh foods. Avoid canned foods and premade or frozen meals. Cooking  Avoid adding salt when cooking. Use salt-free seasonings or herbs instead of table salt or sea salt. Check with your health care provider or pharmacist before using salt substitutes.  Do not fry foods. Cook foods using healthy methods such as baking, boiling, grilling, and broiling instead.  Cook with  heart-healthy oils, such as olive, canola, soybean, or sunflower oil. Meal planning   Eat a balanced diet that includes: ? 5 or more servings of fruits and vegetables each day. At each meal, try to fill half of your plate with fruits and vegetables. ? Up to 6-8 servings of whole grains each day. ? Less than 6 oz of lean meat, poultry, or fish each day. A 3-oz serving of meat is about the same size as a deck of cards. One egg equals 1 oz. ? 2 servings of low-fat dairy each day. ? A serving of nuts, seeds, or beans 5 times each week. ? Heart-healthy fats. Healthy fats called Omega-3 fatty acids are found in foods such as flaxseeds and coldwater fish, like sardines, salmon, and mackerel.  Limit how much you eat of the following: ? Canned or prepackaged foods. ? Food that is high in trans fat, such as fried foods. ? Food that is high in saturated fat, such as fatty meat. ? Sweets, desserts, sugary drinks, and other foods with added sugar. ? Full-fat dairy products.  Do not salt foods before eating.  Try to eat at least 2 vegetarian meals each week.  Eat more home-cooked food and less restaurant, buffet, and fast food.  When eating at a restaurant, ask that your food be prepared with less salt or no salt, if possible. What foods are recommended? The items listed may not be a complete list. Talk with your dietitian about what   dietary choices are best for you. Grains Whole-grain or whole-wheat bread. Whole-grain or whole-wheat pasta. Brown rice. Oatmeal. Quinoa. Bulgur. Whole-grain and low-sodium cereals. Pita bread. Low-fat, low-sodium crackers. Whole-wheat flour tortillas. Vegetables Fresh or frozen vegetables (raw, steamed, roasted, or grilled). Low-sodium or reduced-sodium tomato and vegetable juice. Low-sodium or reduced-sodium tomato sauce and tomato paste. Low-sodium or reduced-sodium canned vegetables. Fruits All fresh, dried, or frozen fruit. Canned fruit in natural juice (without  added sugar). Meat and other protein foods Skinless chicken or turkey. Ground chicken or turkey. Pork with fat trimmed off. Fish and seafood. Egg whites. Dried beans, peas, or lentils. Unsalted nuts, nut butters, and seeds. Unsalted canned beans. Lean cuts of beef with fat trimmed off. Low-sodium, lean deli meat. Dairy Low-fat (1%) or fat-free (skim) milk. Fat-free, low-fat, or reduced-fat cheeses. Nonfat, low-sodium ricotta or cottage cheese. Low-fat or nonfat yogurt. Low-fat, low-sodium cheese. Fats and oils Soft margarine without trans fats. Vegetable oil. Low-fat, reduced-fat, or light mayonnaise and salad dressings (reduced-sodium). Canola, safflower, olive, soybean, and sunflower oils. Avocado. Seasoning and other foods Herbs. Spices. Seasoning mixes without salt. Unsalted popcorn and pretzels. Fat-free sweets. What foods are not recommended? The items listed may not be a complete list. Talk with your dietitian about what dietary choices are best for you. Grains Baked goods made with fat, such as croissants, muffins, or some breads. Dry pasta or rice meal packs. Vegetables Creamed or fried vegetables. Vegetables in a cheese sauce. Regular canned vegetables (not low-sodium or reduced-sodium). Regular canned tomato sauce and paste (not low-sodium or reduced-sodium). Regular tomato and vegetable juice (not low-sodium or reduced-sodium). Pickles. Olives. Fruits Canned fruit in a light or heavy syrup. Fried fruit. Fruit in cream or butter sauce. Meat and other protein foods Fatty cuts of meat. Ribs. Fried meat. Bacon. Sausage. Bologna and other processed lunch meats. Salami. Fatback. Hotdogs. Bratwurst. Salted nuts and seeds. Canned beans with added salt. Canned or smoked fish. Whole eggs or egg yolks. Chicken or turkey with skin. Dairy Whole or 2% milk, cream, and half-and-half. Whole or full-fat cream cheese. Whole-fat or sweetened yogurt. Full-fat cheese. Nondairy creamers. Whipped toppings.  Processed cheese and cheese spreads. Fats and oils Butter. Stick margarine. Lard. Shortening. Ghee. Bacon fat. Tropical oils, such as coconut, palm kernel, or palm oil. Seasoning and other foods Salted popcorn and pretzels. Onion salt, garlic salt, seasoned salt, table salt, and sea salt. Worcestershire sauce. Tartar sauce. Barbecue sauce. Teriyaki sauce. Soy sauce, including reduced-sodium. Steak sauce. Canned and packaged gravies. Fish sauce. Oyster sauce. Cocktail sauce. Horseradish that you find on the shelf. Ketchup. Mustard. Meat flavorings and tenderizers. Bouillon cubes. Hot sauce and Tabasco sauce. Premade or packaged marinades. Premade or packaged taco seasonings. Relishes. Regular salad dressings. Where to find more information:  National Heart, Lung, and Blood Institute: www.nhlbi.nih.gov  American Heart Association: www.heart.org Summary  The DASH eating plan is a healthy eating plan that has been shown to reduce high blood pressure (hypertension). It may also reduce your risk for type 2 diabetes, heart disease, and stroke.  With the DASH eating plan, you should limit salt (sodium) intake to 2,300 mg a day. If you have hypertension, you may need to reduce your sodium intake to 1,500 mg a day.  When on the DASH eating plan, aim to eat more fresh fruits and vegetables, whole grains, lean proteins, low-fat dairy, and heart-healthy fats.  Work with your health care provider or diet and nutrition specialist (dietitian) to adjust your eating plan to your individual   calorie needs. This information is not intended to replace advice given to you by your health care provider. Make sure you discuss any questions you have with your health care provider. Document Released: 05/28/2011 Document Revised: 06/01/2016 Document Reviewed: 06/01/2016 Elsevier Interactive Patient Education  2018 [employer]  

## 2018-03-08 NOTE — Assessment & Plan Note (Signed)
Poorly controlled will alter medications, encouraged DASH diet, minimize caffeine and obtain adequate sleep. Report concerning symptoms and follow up as directed and as needed 

## 2018-03-10 DIAGNOSIS — M170 Bilateral primary osteoarthritis of knee: Secondary | ICD-10-CM | POA: Diagnosis not present

## 2018-03-24 ENCOUNTER — Ambulatory Visit: Payer: 59

## 2018-03-24 ENCOUNTER — Other Ambulatory Visit: Payer: 59

## 2018-03-25 ENCOUNTER — Ambulatory Visit: Payer: 59

## 2018-03-25 ENCOUNTER — Other Ambulatory Visit (INDEPENDENT_AMBULATORY_CARE_PROVIDER_SITE_OTHER): Payer: 59

## 2018-03-25 ENCOUNTER — Other Ambulatory Visit: Payer: 59

## 2018-03-25 ENCOUNTER — Ambulatory Visit (INDEPENDENT_AMBULATORY_CARE_PROVIDER_SITE_OTHER): Payer: 59 | Admitting: Family Medicine

## 2018-03-25 VITALS — BP 160/92 | HR 64

## 2018-03-25 DIAGNOSIS — I10 Essential (primary) hypertension: Secondary | ICD-10-CM

## 2018-03-25 LAB — COMPREHENSIVE METABOLIC PANEL
ALK PHOS: 82 U/L (ref 39–117)
ALT: 19 U/L (ref 0–35)
AST: 15 U/L (ref 0–37)
Albumin: 3.9 g/dL (ref 3.5–5.2)
BUN: 22 mg/dL (ref 6–23)
CO2: 30 mEq/L (ref 19–32)
CREATININE: 0.8 mg/dL (ref 0.40–1.20)
Calcium: 9.3 mg/dL (ref 8.4–10.5)
Chloride: 105 mEq/L (ref 96–112)
GFR: 78.13 mL/min (ref >60.00)
GLUCOSE: 92 mg/dL (ref 70–99)
Potassium: 4.1 mEq/L (ref 3.5–5.1)
SODIUM: 141 meq/L (ref 135–145)
TOTAL PROTEIN: 6.9 g/dL (ref 6.0–8.3)
Total Bilirubin: 0.7 mg/dL (ref 0.2–1.2)

## 2018-03-25 LAB — LIPID PANEL
Cholesterol: 145 mg/dL (ref 0–200)
HDL: 62.3 mg/dL (ref >39.00)
LDL Cholesterol: 66 mg/dL (ref 0–99)
NonHDL: 82.36
Total CHOL/HDL Ratio: 2
Triglycerides: 82 mg/dL (ref 0.0–149.0)
VLDL: 16.4 mg/dL (ref 0.0–40.0)

## 2018-03-25 NOTE — Progress Notes (Signed)
Noted. Agree with above.  Unfortunately she did not take her blood pressure medication so we are unable to make an informed decision about her blood pressure management today.  [place], DO 03/25/18 12:16 PM

## 2018-03-25 NOTE — Progress Notes (Signed)
Pre visit review using our clinic tool,if applicable. No additional management support is needed unless otherwise documented below in the visit note.   Pt here for Blood pressure check per   Pt currently takes: Carvedilol 12.5 mg bid   Pt reports compliance with medication. States she has not had BP medication today because she is having labs drawn after this visit.  BP today  = 160/92 HR = 64  Pt advised per Dr. Nani Ravens Patient to come back in 1 week and to make sure she has taken her BP medications before she comes. Patient agreed states se will check with her job to see what time and day she can come and call back for appointment. Patient proceeded  to the lab.

## 2018-04-13 DIAGNOSIS — J3081 Allergic rhinitis due to animal (cat) (dog) hair and dander: Secondary | ICD-10-CM | POA: Diagnosis not present

## 2018-04-13 DIAGNOSIS — R05 Cough: Secondary | ICD-10-CM | POA: Diagnosis not present

## 2018-04-13 DIAGNOSIS — J301 Allergic rhinitis due to pollen: Secondary | ICD-10-CM | POA: Diagnosis not present

## 2018-04-13 DIAGNOSIS — J3089 Other allergic rhinitis: Secondary | ICD-10-CM | POA: Diagnosis not present

## 2018-04-14 ENCOUNTER — Encounter (INDEPENDENT_AMBULATORY_CARE_PROVIDER_SITE_OTHER): Payer: 59

## 2018-04-19 ENCOUNTER — Encounter (INDEPENDENT_AMBULATORY_CARE_PROVIDER_SITE_OTHER): Payer: Self-pay | Admitting: Family Medicine

## 2018-04-19 ENCOUNTER — Ambulatory Visit (INDEPENDENT_AMBULATORY_CARE_PROVIDER_SITE_OTHER): Payer: 59 | Admitting: Family Medicine

## 2018-04-19 VITALS — BP 160/90 | HR 62 | Temp 97.5°F | Ht 65.0 in | Wt 284.0 lb

## 2018-04-19 DIAGNOSIS — Z9189 Other specified personal risk factors, not elsewhere classified: Secondary | ICD-10-CM | POA: Diagnosis not present

## 2018-04-19 DIAGNOSIS — I10 Essential (primary) hypertension: Secondary | ICD-10-CM | POA: Diagnosis not present

## 2018-04-19 DIAGNOSIS — R5383 Other fatigue: Secondary | ICD-10-CM

## 2018-04-19 DIAGNOSIS — R0602 Shortness of breath: Secondary | ICD-10-CM | POA: Diagnosis not present

## 2018-04-19 DIAGNOSIS — Z6841 Body Mass Index (BMI) 40.0 and over, adult: Secondary | ICD-10-CM

## 2018-04-19 DIAGNOSIS — Z0289 Encounter for other administrative examinations: Secondary | ICD-10-CM

## 2018-04-19 DIAGNOSIS — Z1331 Encounter for screening for depression: Secondary | ICD-10-CM | POA: Diagnosis not present

## 2018-04-19 NOTE — Progress Notes (Signed)
Office: [phone]  /  Fax: [phone]   Dear Dr. Carollee Herter,   Thank you for referring Kirsten Nelson to our clinic. The following note includes my evaluation and treatment recommendations.  HPI:   Chief Complaint: OBESITY    Kirsten Nelson has been referred by Ann Held, DO for consultation regarding her obesity and obesity related comorbidities.    Kirsten Nelson (MR# [id number]) is a 57 y.o. female who presents on 04/19/2018 for obesity evaluation and treatment. Current BMI is Body mass index is 47.26 kg/m.Marland Kitchen Kirsten Nelson has been struggling with her weight for many years and has been unsuccessful in either losing weight, maintaining weight loss, or reaching her healthy weight goal.     Kirsten Nelson attended our information session and states she is currently in the action stage of change and ready to dedicate time achieving and maintaining a healthier weight. Kirsten Nelson is interested in becoming our patient and working on intensive lifestyle modifications including (but not limited to) diet, exercise and weight loss.      Kirsten Nelson needs to get her BMI below 40 in order to have knee replacement surgery.    Kirsten Nelson states her family eats meals together she thinks her family will eat healthier with  her her desired weight loss is 115 lbs she has been heavy most of  her life she started gaining weight at 89 yrs of age her heaviest weight ever was 295 lbs. she has significant food cravings issues  she snacks frequently in the evenings she wakes up frquently in the middle of the night to eat she is frequently drinking liquids with calories she frequently makes poor food choices she has problems with excessive hunger  she frequently eats larger portions than normal  she struggles with emotional eating    Fatigue Emonii feels her energy is lower than it should be. This has worsened with weight gain and has not worsened recently. Wileen admits to daytime somnolence and admits to waking up  still tired. Patient is at risk for obstructive sleep apnea. Patent has a history of symptoms of restless sleep. Patient generally gets 6 hours of sleep per night, and states they generally have restless sleep. Snoring is not present. Apneic episodes are not present. Epworth Sleepiness Score is 4  Dyspnea on exertion Kirsten Nelson notes increasing shortness of breath with exercising and seems to be worsening over time with weight gain. She notes getting out of breath sooner with activity than she used to. This has not gotten worse recently. Kirsten Nelson denies orthopnea.  Hypertension Kirsten Nelson is a 57 y.o. female with hypertension. She is currently taking carvedilol. Her blood pressure is elevated today at 160/90 and she didn't take her medications this morning. Kirsten Nelson denies chest pain or headache. She is working weight loss to help control her blood pressure with the goal of decreasing her risk of heart attack and stroke. Kirsten Nelson blood pressure is not currently controlled.  At risk for cardiovascular disease Kirsten Nelson is at a higher than average risk for cardiovascular disease due to obesity and hypertension. She currently denies any chest pain.  Depression Screen Kirsten Nelson's Food and Mood (modified PHQ-9) score was  Depression screen PHQ 2/9 04/19/2018  Decreased Interest 2  Down, Depressed, Hopeless 1  PHQ - 2 Score 3  Altered sleeping 3  Tired, decreased energy 3  Change in appetite 2  Feeling bad or failure about yourself  1  Trouble concentrating 0  Moving slowly or fidgety/restless 3  Suicidal  thoughts 0  PHQ-9 Score 15  Difficult doing work/chores Somewhat difficult    ALLERGIES: Allergies  Allergen Reactions  . Ace Inhibitors Cough    MEDICATIONS: Current Outpatient Medications on File Prior to Visit  Medication Sig Dispense Refill  . BREO ELLIPTA 100-25 MCG/INH AEPB Take 1 puff by mouth daily.  2  . carvedilol (COREG) 12.5 MG tablet Take 1 tablet (12.5 mg total) by mouth 2  (two) times daily with a meal. 180 tablet 1  . levocetirizine (XYZAL) 5 MG tablet Take 5 mg by mouth daily as needed.     . Montelukast Sodium (SINGULAIR PO) Take 10 mg by mouth daily as needed.      No current facility-administered medications on file prior to visit.     PAST MEDICAL HISTORY: Past Medical History:  Diagnosis Date  . Allergy   . Environmental allergies   . HTN (hypertension)   . Joint pain   . Obesity     PAST SURGICAL HISTORY: Past Surgical History:  Procedure Laterality Date  . TONSILLECTOMY      SOCIAL HISTORY: Social History   Tobacco Use  . Smoking status: Never Smoker  . Smokeless tobacco: Never Used  Substance Use Topics  . Alcohol use: No  . Drug use: No    FAMILY HISTORY: Family History  Problem Relation Age of Onset  . Hypertension Father   . Leukemia Father   . Lymphoma Father   . Cancer Father   . Hypertension Mother   . Cancer Mother   . Colon cancer Neg Hx   . Esophageal cancer Neg Hx   . Rectal cancer Neg Hx   . Stomach cancer Neg Hx     ROS: Review of Systems  Constitutional: Positive for malaise/fatigue.  HENT: Positive for tinnitus.   Respiratory: Positive for shortness of breath (on exertion).   Cardiovascular: Negative for chest pain and orthopnea.       + Calf/Leg Pain with Walking   Musculoskeletal:       + Muscle or Joint Pain  Neurological: Negative for headaches.  Psychiatric/Behavioral: The patient has insomnia.     PHYSICAL EXAM: Blood pressure (!) 160/90, pulse 62, temperature (!) 97.5 F (36.4 C), temperature source Oral, height 5\' 5"  (1.651 m), weight 284 lb (128.8 kg), SpO2 95 %. Body mass index is 47.26 kg/m. Physical Exam  Constitutional: She is oriented to person, place, and time. She appears well-developed and well-nourished.  HENT:  Head: Normocephalic and atraumatic.  Nose: Nose normal.  Eyes: EOM are normal. No scleral icterus.  Neck: Normal range of motion. Neck supple. No thyromegaly  present.  Cardiovascular: Normal rate and regular rhythm.  Pulmonary/Chest: Effort normal. No respiratory distress.  Abdominal: Soft. There is no tenderness.  + Obesity  Musculoskeletal: Normal range of motion.  Range of Motion normal in all 4 extremities  Neurological: She is alert and oriented to person, place, and time. Coordination normal.  Skin: Skin is warm and dry.  Psychiatric: She has a normal mood and affect.  Vitals reviewed.   RECENT LABS AND TESTS: BMET    Component Value Date/Time   NA 141 03/25/2018 1035   K 4.1 03/25/2018 1035   CL 105 03/25/2018 1035   CO2 30 03/25/2018 1035   GLUCOSE 92 03/25/2018 1035   BUN 22 03/25/2018 1035   CREATININE 0.80 03/25/2018 1035   CALCIUM 9.3 03/25/2018 1035   No results found for: HGBA1C No results found for: INSULIN CBC  Component Value Date/Time   WBC 8.3 05/19/2016 0942   RBC 5.18 (H) 05/19/2016 0942   HGB 14.5 05/19/2016 0942   HCT 44.2 05/19/2016 0942   PLT 239.0 05/19/2016 0942   MCV 85.4 05/19/2016 0942   MCHC 32.8 05/19/2016 0942   RDW 13.7 05/19/2016 0942   LYMPHSABS 2.1 05/19/2016 0942   MONOABS 0.5 05/19/2016 0942   EOSABS 0.3 05/19/2016 0942   BASOSABS 0.0 05/19/2016 0942   Iron/TIBC/Ferritin/ %Sat No results found for: IRON, TIBC, FERRITIN, IRONPCTSAT Lipid Panel     Component Value Date/Time   CHOL 145 03/25/2018 1035   TRIG 82.0 03/25/2018 1035   HDL 62.30 03/25/2018 1035   CHOLHDL 2 03/25/2018 1035   VLDL 16.4 03/25/2018 1035   LDLCALC 66 03/25/2018 1035   Hepatic Function Panel     Component Value Date/Time   PROT 6.9 03/25/2018 1035   ALBUMIN 3.9 03/25/2018 1035   AST 15 03/25/2018 1035   ALT 19 03/25/2018 1035   ALKPHOS 82 03/25/2018 1035   BILITOT 0.7 03/25/2018 1035   BILIDIR 0.1 03/30/2012 0921      Component Value Date/Time   TSH 1.42 03/16/2016 1643   TSH 1.37 03/30/2012 0921    ECG  shows NSR with a rate of 67 BPM INDIRECT CALORIMETER done today shows a VO2 of 218  and a REE of 1519.  Her calculated basal metabolic rate is 3810 thus her basal metabolic rate is worse than expected.    ASSESSMENT AND PLAN: Other fatigue - Plan: EKG 12-Lead, Vitamin B12, CBC With Differential, Folate, Hemoglobin A1c, Insulin, random, Lipid Panel With LDL/HDL Ratio, T3, T4, free, TSH, VITAMIN D 25 Hydroxy (Vit-D Deficiency, Fractures)  Shortness of breath on exertion  Essential hypertension - Plan: Comprehensive metabolic panel  Depression screening  At risk for heart disease  Class 3 severe obesity with serious comorbidity and body mass index (BMI) of 45.0 to 49.9 in adult, unspecified obesity type (HCC)  PLAN: Fatigue Kirsten Nelson was informed that her fatigue may be related to obesity, depression or many other causes. Labs will be ordered, and in the meanwhile Shadie has agreed to work on diet, exercise and weight loss to help with fatigue. Proper sleep hygiene was discussed including the need for 7-8 hours of quality sleep each night. A sleep study was not ordered based on symptoms and Epworth score.  Dyspnea on exertion Kirsten Nelson's shortness of breath appears to be obesity related and exercise induced. She has agreed to work on weight loss and gradually increase exercise to treat her exercise induced shortness of breath. If Kirsten Nelson follows our instructions and loses weight without improvement of her shortness of breath, we will plan to refer to pulmonology. We will monitor this condition regularly. Aquanetta agrees to this plan.  Hypertension We discussed sodium restriction, working on healthy weight loss, and a regular exercise program as the means to achieve improved blood pressure control. Kirsten Nelson agreed with this plan and agreed to follow up as directed. We will recheck blood pressure in 2 weeks and will continue to monitor her blood pressure as well as her progress with the above lifestyle modifications. She will continue her medications as prescribed and will watch for signs of  hypotension as she continues her lifestyle modifications.  Cardiovascular risk counselling Kirsten Nelson was given extended (15 minutes) coronary artery disease prevention counseling today. She is 57 y.o. female and has risk factors for heart disease including obesity and hypertension. We discussed intensive lifestyle modifications today with an emphasis  on specific weight loss instructions and strategies. Pt was also informed of the importance of increasing exercise and decreasing saturated fats to help prevent heart disease.  Depression Screen Kirsten Nelson had a strongly positive depression screening. Depression is commonly associated with obesity and often results in emotional eating behaviors. We will monitor this closely and work on CBT to help improve the non-hunger eating patterns. Referral to Psychology may be required if no improvement is seen as she continues in our clinic.  Obesity Kirsten Nelson is currently in the action stage of change and her goal is to continue with weight loss efforts. I recommend Kamirah begin the structured treatment plan as follows:  She has agreed to follow the Category 2 plan Yamileth has been instructed to eventually work up to a goal of 150 minutes of combined cardio and strengthening exercise per week for weight loss and overall health benefits. We discussed the following Behavioral Modification Strategies today: increasing lean protein intake, decreasing simple carbohydrates , decrease eating out and work on meal planning and easy cooking plans   She was informed of the importance of frequent follow up visits to maximize her success with intensive lifestyle modifications for her multiple health conditions. She was informed we would discuss her lab results at her next visit unless there is a critical issue that needs to be addressed sooner. Shellyann agreed to keep her next visit at the agreed upon time to discuss these results.    OBESITY BEHAVIORAL INTERVENTION VISIT  Today's visit  was # 1   Starting weight: 284 lbs Starting date: 04/19/18 Today's weight : 284 lbs Today's date: 04/19/2018 Total lbs lost to date: 0   ASK: We discussed the diagnosis of obesity with Kirsten Nelson today and Hayde agreed to give [name] permission to discuss obesity behavioral modification therapy today.  ASSESS: Wendy has the diagnosis of obesity and her BMI today is 47.26 Amna is in the action stage of change   ADVISE: Amadi was educated on the multiple health risks of obesity as well as the benefit of weight loss to improve her health. She was advised of the need for long term treatment and the importance of lifestyle modifications to improve her current health and to decrease her risk of future health problems.  AGREE: Multiple dietary modification options and treatment options were discussed and  Eldora agreed to follow the recommendations documented in the above note.  ARRANGE: Dwanda was educated on the importance of frequent visits to treat obesity as outlined per CMS and USPSTF guidelines and agreed to schedule her next follow up appointment today.  I, Doreene Nest, am acting as transcriptionist for Dennard Nip, MD  I have reviewed the above documentation for accuracy and completeness, and I agree with the above. -Dennard Nip, MD

## 2018-04-20 ENCOUNTER — Encounter (INDEPENDENT_AMBULATORY_CARE_PROVIDER_SITE_OTHER): Payer: Self-pay | Admitting: Family Medicine

## 2018-04-20 LAB — CBC WITH DIFFERENTIAL
Basophils Absolute: 0 10*3/uL (ref 0.0–0.2)
Basos: 0 %
EOS (ABSOLUTE): 0.2 10*3/uL (ref 0.0–0.4)
Eos: 2 %
Hematocrit: 44.3 % (ref 34.0–46.6)
Hemoglobin: 14.8 g/dL (ref 11.1–15.9)
IMMATURE GRANULOCYTES: 1 %
Immature Grans (Abs): 0.1 10*3/uL (ref 0.0–0.1)
Lymphocytes Absolute: 2.1 10*3/uL (ref 0.7–3.1)
Lymphs: 23 %
MCH: 28.6 pg (ref 26.6–33.0)
MCHC: 33.4 g/dL (ref 31.5–35.7)
MCV: 86 fL (ref 79–97)
Monocytes Absolute: 0.8 10*3/uL (ref 0.1–0.9)
Monocytes: 8 %
Neutrophils Absolute: 5.9 10*3/uL (ref 1.4–7.0)
Neutrophils: 66 %
RBC: 5.17 x10E6/uL (ref 3.77–5.28)
RDW: 12.9 % (ref 12.3–15.4)
WBC: 9.1 10*3/uL (ref 3.4–10.8)

## 2018-04-20 LAB — COMPREHENSIVE METABOLIC PANEL
ALT: 21 IU/L (ref 0–32)
AST: 16 IU/L (ref 0–40)
Albumin/Globulin Ratio: 1.1 — ABNORMAL LOW (ref 1.2–2.2)
Albumin: 3.5 g/dL (ref 3.5–5.5)
Alkaline Phosphatase: 76 IU/L (ref 39–117)
BUN/Creatinine Ratio: 18 (ref 9–23)
BUN: 15 mg/dL (ref 6–24)
Bilirubin Total: 0.5 mg/dL (ref 0.0–1.2)
CALCIUM: 9 mg/dL (ref 8.7–10.2)
CHLORIDE: 102 mmol/L (ref 96–106)
CO2: 26 mmol/L (ref 20–29)
Creatinine, Ser: 0.84 mg/dL (ref 0.57–1.00)
GFR calc Af Amer: 89 mL/min/{1.73_m2} (ref >59)
GFR calc non Af Amer: 77 mL/min/{1.73_m2} (ref >59)
Globulin, Total: 3.1 g/dL (ref 1.5–4.5)
Glucose: 96 mg/dL (ref 65–99)
POTASSIUM: 4.1 mmol/L (ref 3.5–5.2)
Sodium: 142 mmol/L (ref 134–144)
Total Protein: 6.6 g/dL (ref 6.0–8.5)

## 2018-04-20 LAB — LIPID PANEL WITH LDL/HDL RATIO
Cholesterol, Total: 141 mg/dL (ref 100–199)
HDL: 57 mg/dL (ref >39)
LDL Calculated: 64 mg/dL (ref 0–99)
LDL/HDL RATIO: 1.1 ratio (ref 0.0–3.2)
Triglycerides: 99 mg/dL (ref 0–149)
VLDL Cholesterol Cal: 20 mg/dL (ref 5–40)

## 2018-04-20 LAB — FOLATE: FOLATE: 10.8 ng/mL (ref >3.0)

## 2018-04-20 LAB — T4, FREE: FREE T4: 1.45 ng/dL (ref 0.82–1.77)

## 2018-04-20 LAB — HEMOGLOBIN A1C
ESTIMATED AVERAGE GLUCOSE: 126 mg/dL
Hgb A1c MFr Bld: 6 % — ABNORMAL HIGH (ref 4.8–5.6)

## 2018-04-20 LAB — TSH: TSH: 0.87 u[IU]/mL (ref 0.450–4.500)

## 2018-04-20 LAB — VITAMIN D 25 HYDROXY (VIT D DEFICIENCY, FRACTURES): Vit D, 25-Hydroxy: 14.5 ng/mL — ABNORMAL LOW (ref 30.0–100.0)

## 2018-04-20 LAB — VITAMIN B12: VITAMIN B 12: 592 pg/mL (ref 232–1245)

## 2018-04-20 LAB — T3: T3 TOTAL: 114 ng/dL (ref 71–180)

## 2018-04-20 LAB — INSULIN, RANDOM: INSULIN: 20.9 u[IU]/mL (ref 2.6–24.9)

## 2018-05-03 ENCOUNTER — Ambulatory Visit (INDEPENDENT_AMBULATORY_CARE_PROVIDER_SITE_OTHER): Payer: 59 | Admitting: Family Medicine

## 2018-05-03 VITALS — BP 145/79 | HR 58 | Ht 65.0 in | Wt 280.0 lb

## 2018-05-03 DIAGNOSIS — R7303 Prediabetes: Secondary | ICD-10-CM

## 2018-05-03 DIAGNOSIS — Z9189 Other specified personal risk factors, not elsewhere classified: Secondary | ICD-10-CM

## 2018-05-03 DIAGNOSIS — E559 Vitamin D deficiency, unspecified: Secondary | ICD-10-CM | POA: Diagnosis not present

## 2018-05-03 DIAGNOSIS — Z6841 Body Mass Index (BMI) 40.0 and over, adult: Secondary | ICD-10-CM

## 2018-05-03 MED ORDER — VITAMIN D (ERGOCALCIFEROL) 1.25 MG (50000 UNIT) PO CAPS: 50000.0000 [IU] | ORAL_CAPSULE | ORAL | 0 refills | Status: DC

## 2018-05-05 NOTE — Progress Notes (Signed)
Office: [phone]  /  Fax: [phone]   HPI:   Chief Complaint: OBESITY Kirsten Nelson is here to discuss her progress with her obesity treatment plan. She is on the  follow the Category 2 plan and is following her eating plan approximately 95 % of the time. She states she is exercising 0 minutes 0 times per week. Kirsten Nelson did well with weight loss on her Category 2 plan. She really didn't cook for dinner, but still tried to stick with the plan.  Her weight is 280 lb (127 kg) today and has had a weight loss of 4 pounds over a period of 2 weeks since her last visit. She has lost 4 lbs since starting treatment with [name].  Vitamin D deficiency Kirsten Nelson has a new diagnosis of vitamin D deficiency. She is not currently taking vit D and denies nausea, vomiting, or muscle weakness.  Pre-Diabetes Kirsten Nelson has a new diagnosis of pre-diabetes based on her elevated Hgb A1c and was informed this puts her at greater risk of developing diabetes. Her A1c is 6.0 on 04/19/18 and she notes polyphagia is decreased on her Category 2 plan. She is not taking metformin currently and continues to work on diet and exercise to decrease risk of diabetes.   At risk for diabetes Kirsten Nelson is at higher than average risk for developing diabetes due to her pre-diabetes and obesity. She currently denies polyuria or polydipsia.  ALLERGIES: Allergies  Allergen Reactions  . Ace Inhibitors Cough    MEDICATIONS: Current Outpatient Medications on File Prior to Visit  Medication Sig Dispense Refill  . BREO ELLIPTA 100-25 MCG/INH AEPB Take 1 puff by mouth daily.  2  . carvedilol (COREG) 12.5 MG tablet Take 1 tablet (12.5 mg total) by mouth 2 (two) times daily with a meal. 180 tablet 1  . levocetirizine (XYZAL) 5 MG tablet Take 5 mg by mouth daily as needed.     . Montelukast Sodium (SINGULAIR PO) Take 10 mg by mouth daily as needed.      No current facility-administered medications on file prior to visit.     PAST MEDICAL  HISTORY: Past Medical History:  Diagnosis Date  . Allergy   . Environmental allergies   . HTN (hypertension)   . Joint pain   . Obesity     PAST SURGICAL HISTORY: Past Surgical History:  Procedure Laterality Date  . TONSILLECTOMY      SOCIAL HISTORY: Social History   Tobacco Use  . Smoking status: Never Smoker  . Smokeless tobacco: Never Used  Substance Use Topics  . Alcohol use: No  . Drug use: No    FAMILY HISTORY: Family History  Problem Relation Age of Onset  . Hypertension Father   . Leukemia Father   . Lymphoma Father   . Cancer Father   . Hypertension Mother   . Cancer Mother   . Colon cancer Neg Hx   . Esophageal cancer Neg Hx   . Rectal cancer Neg Hx   . Stomach cancer Neg Hx     ROS: Review of Systems  Constitutional: Positive for weight loss.  Gastrointestinal: Negative for nausea and vomiting.  Genitourinary:       Negative for polyuria.  Musculoskeletal:       Negative for muscle weakness.  Endo/Heme/Allergies: Negative for polydipsia.    PHYSICAL EXAM: Blood pressure (!) 145/79, pulse (!) 58, height 5\' 5"  (1.651 m), weight 280 lb (127 kg), SpO2 96 %. Body mass index is 46.59 kg/m. Physical Exam  Constitutional: She is oriented to person, place, and time. She appears well-developed and well-nourished.  Cardiovascular: Normal rate.  Pulmonary/Chest: Effort normal.  Musculoskeletal: Normal range of motion.  Neurological: She is oriented to person, place, and time.  Skin: Skin is dry.  Psychiatric: She has a normal mood and affect. Her behavior is normal.  Vitals reviewed.   RECENT LABS AND TESTS: BMET    Component Value Date/Time   NA 142 04/19/2018 0929   K 4.1 04/19/2018 0929   CL 102 04/19/2018 0929   CO2 26 04/19/2018 0929   GLUCOSE 96 04/19/2018 0929   GLUCOSE 92 03/25/2018 1035   BUN 15 04/19/2018 0929   CREATININE 0.84 04/19/2018 0929   CALCIUM 9.0 04/19/2018 0929   GFRNONAA 77 04/19/2018 0929   GFRAA 89 04/19/2018  0929   Lab Results  Component Value Date   HGBA1C 6.0 (H) 04/19/2018   Lab Results  Component Value Date   INSULIN 20.9 04/19/2018   CBC    Component Value Date/Time   WBC 9.1 04/19/2018 0929   WBC 8.3 05/19/2016 0942   RBC 5.17 04/19/2018 0929   RBC 5.18 (H) 05/19/2016 0942   HGB 14.8 04/19/2018 0929   HCT 44.3 04/19/2018 0929   PLT 239.0 05/19/2016 0942   MCV 86 04/19/2018 0929   MCH 28.6 04/19/2018 0929   MCHC 33.4 04/19/2018 0929   MCHC 32.8 05/19/2016 0942   RDW 12.9 04/19/2018 0929   LYMPHSABS 2.1 04/19/2018 0929   MONOABS 0.5 05/19/2016 0942   EOSABS 0.2 04/19/2018 0929   BASOSABS 0.0 04/19/2018 0929   Iron/TIBC/Ferritin/ %Sat No results found for: IRON, TIBC, FERRITIN, IRONPCTSAT Lipid Panel     Component Value Date/Time   CHOL 141 04/19/2018 0929   TRIG 99 04/19/2018 0929   HDL 57 04/19/2018 0929   CHOLHDL 2 03/25/2018 1035   VLDL 16.4 03/25/2018 1035   LDLCALC 64 04/19/2018 0929   Hepatic Function Panel     Component Value Date/Time   PROT 6.6 04/19/2018 0929   ALBUMIN 3.5 04/19/2018 0929   AST 16 04/19/2018 0929   ALT 21 04/19/2018 0929   ALKPHOS 76 04/19/2018 0929   BILITOT 0.5 04/19/2018 0929   BILIDIR 0.1 03/30/2012 0921      Component Value Date/Time   TSH 0.870 04/19/2018 0929   TSH 1.42 03/16/2016 1643   TSH 1.37 03/30/2012 0921   Results for KAYTLYNNE, NEACE (MRN [id number]) as of 05/05/2018 13:59  Ref. Range 04/19/2018 09:29  Vitamin D, 25-Hydroxy Latest Ref Range: 30.0 - 100.0 ng/mL 14.5 (L)   ASSESSMENT AND PLAN: Vitamin D deficiency - Plan: Vitamin D, Ergocalciferol, (DRISDOL) 1.25 MG (50000 UT) CAPS capsule  Prediabetes  At risk for diabetes mellitus  Class 3 severe obesity due to excess calories with body mass index (BMI) of 45.0 to 49.9 in adult, unspecified whether serious comorbidity present ([place])  PLAN:  Vitamin D Deficiency Kirsten Nelson was informed that low vitamin D levels contributes to fatigue and are associated with  obesity, breast, and colon cancer. She agrees to begin to take prescription Vit D @50 ,000 IU every week #4 with no refills and will follow up for routine testing of vitamin D, at least 2-3 times per year. She was informed of the risk of over-replacement of vitamin D and agrees to not increase her dose unless she discusses this with [name] first. Kirsten Nelson agrees to follow up in 2 weeks.  Pre-Diabetes Kirsten Nelson will continue to work on weight loss, exercise, and decreasing  simple carbohydrates in her diet to help decrease the risk of diabetes. We discussed metformin including benefits and risks. She was informed that eating too many simple carbohydrates or too many calories at one sitting increases the likelihood of GI side effects. Kirsten Nelson deferred metformin for now and a prescription was not written today. We will recheck labs in 3 months. Kirsten Nelson agreed to continue with her diet and exercise and follow up with [name] as directed to monitor her progress.  Diabetes risk counseling Kirsten Nelson was given extended (15 minutes) diabetes prevention counseling today. She is 56 y.o. female and has risk factors for diabetes including pre-diabetes and obesity. We discussed intensive lifestyle modifications today with an emphasis on weight loss as well as increasing exercise and decreasing simple carbohydrates in her diet.  Obesity Kirsten Nelson is currently in the action stage of change. As such, her goal is to continue with weight loss efforts. She has agreed to follow the Category 2 plan. Kirsten Nelson has been instructed to work up to a goal of 150 minutes of combined cardio and strengthening exercise per week for weight loss and overall health benefits. We discussed the following Behavioral Modification Strategies today: increasing lean protein intake, decreasing simple carbohydrates, and work on meal planning and easy cooking plans.  Kirsten Nelson has agreed to follow up with our clinic in 2 weeks. She was informed of the importance of frequent follow up  visits to maximize her success with intensive lifestyle modifications for her multiple health conditions.   OBESITY BEHAVIORAL INTERVENTION VISIT  Today's visit was # 2   Starting weight: 284 lbs Starting date: 04/19/18 Today's weight : Weight: 280 lb (127 kg)  Today's date: 05/03/2018 Total lbs lost to date: 4  ASK: We discussed the diagnosis of obesity with Kirsten Nelson today and Kirsten Nelson agreed to give [name] permission to discuss obesity behavioral modification therapy today.  ASSESS: Kirsten Nelson has the diagnosis of obesity and her BMI today is 46.59. Kirsten Nelson is in the action stage of change.   ADVISE: Kirsten Nelson was educated on the multiple health risks of obesity as well as the benefit of weight loss to improve her health. She was advised of the need for long term treatment and the importance of lifestyle modifications to improve her current health and to decrease her risk of future health problems.  AGREE: Multiple dietary modification options and treatment options were discussed and Kirsten Nelson agreed to follow the recommendations documented in the above note.  ARRANGE: Kirsten Nelson was educated on the importance of frequent visits to treat obesity as outlined per CMS and USPSTF guidelines and agreed to schedule her next follow up appointment today.  I, Marcille Blanco, am acting as transcriptionist for Starlyn Skeans, MD  I have reviewed the above documentation for accuracy and completeness, and I agree with the above. -Dennard Nip, MD

## 2018-05-17 ENCOUNTER — Ambulatory Visit (INDEPENDENT_AMBULATORY_CARE_PROVIDER_SITE_OTHER): Payer: 59 | Admitting: Family Medicine

## 2018-05-17 ENCOUNTER — Encounter (INDEPENDENT_AMBULATORY_CARE_PROVIDER_SITE_OTHER): Payer: Self-pay | Admitting: Family Medicine

## 2018-05-17 VITALS — BP 163/96 | HR 65 | Temp 98.1°F | Ht 65.0 in | Wt 281.0 lb

## 2018-05-17 DIAGNOSIS — E559 Vitamin D deficiency, unspecified: Secondary | ICD-10-CM

## 2018-05-17 DIAGNOSIS — Z6841 Body Mass Index (BMI) 40.0 and over, adult: Secondary | ICD-10-CM

## 2018-05-22 ENCOUNTER — Other Ambulatory Visit (INDEPENDENT_AMBULATORY_CARE_PROVIDER_SITE_OTHER): Payer: Self-pay | Admitting: Family Medicine

## 2018-05-22 DIAGNOSIS — E559 Vitamin D deficiency, unspecified: Secondary | ICD-10-CM

## 2018-05-24 ENCOUNTER — Encounter (INDEPENDENT_AMBULATORY_CARE_PROVIDER_SITE_OTHER): Payer: Self-pay | Admitting: Family Medicine

## 2018-05-24 NOTE — Progress Notes (Signed)
Office: [phone]  /  Fax: [phone]   HPI:   Chief Complaint: OBESITY Kirsten Nelson is here to discuss her progress with her obesity treatment plan. She is on the Category 2 plan and is following her eating plan approximately 95 % of the time. She states she is exercising 0 minutes 0 times per week. Kirsten Nelson is currently struggling with not eating all of her meat at dinner. She denies polyphagia.  Her weight is 281 lb (127.5 kg) today and has had a weight gain of 1 pound over a period of 2 weeks since her last visit. She has lost 3 lbs since starting treatment with [name].  Vitamin D deficiency Kirsten Nelson has a diagnosis of vitamin D deficiency. She is currently taking vit D, but is not at goal. She denies nausea, vomiting, or muscle weakness.  ALLERGIES: Allergies  Allergen Reactions  . Ace Inhibitors Cough    MEDICATIONS: Current Outpatient Medications on File Prior to Visit  Medication Sig Dispense Refill  . BREO ELLIPTA 100-25 MCG/INH AEPB Take 1 puff by mouth daily.  2  . carvedilol (COREG) 12.5 MG tablet Take 1 tablet (12.5 mg total) by mouth 2 (two) times daily with a meal. 180 tablet 1  . levocetirizine (XYZAL) 5 MG tablet Take 5 mg by mouth daily as needed.     . Montelukast Sodium (SINGULAIR PO) Take 10 mg by mouth daily as needed.     . Vitamin D, Ergocalciferol, (DRISDOL) 1.25 MG (50000 UT) CAPS capsule Take 1 capsule (50,000 Units total) by mouth every 7 (seven) days. 4 capsule 0   No current facility-administered medications on file prior to visit.     PAST MEDICAL HISTORY: Past Medical History:  Diagnosis Date  . Allergy   . Environmental allergies   . HTN (hypertension)   . Joint pain   . Obesity     PAST SURGICAL HISTORY: Past Surgical History:  Procedure Laterality Date  . TONSILLECTOMY      SOCIAL HISTORY: Social History   Tobacco Use  . Smoking status: Never Smoker  . Smokeless tobacco: Never Used  Substance Use Topics  . Alcohol use: No  . Drug use:  No    FAMILY HISTORY: Family History  Problem Relation Age of Onset  . Hypertension Father   . Leukemia Father   . Lymphoma Father   . Cancer Father   . Hypertension Mother   . Cancer Mother   . Colon cancer Neg Hx   . Esophageal cancer Neg Hx   . Rectal cancer Neg Hx   . Stomach cancer Neg Hx     ROS: Review of Systems  Constitutional: Negative for weight loss.  Gastrointestinal: Negative for nausea and vomiting.  Musculoskeletal:       Negative for muscle weakness.  Endo/Heme/Allergies:       Negative for polyphagia.    PHYSICAL EXAM: Blood pressure (!) 163/96, pulse 65, temperature 98.1 F (36.7 C), temperature source Oral, height 5\' 5"  (1.651 m), weight 281 lb (127.5 kg), SpO2 94 %. Body mass index is 46.76 kg/m. Physical Exam  Constitutional: She is oriented to person, place, and time. She appears well-developed and well-nourished.  Cardiovascular: Normal rate.  Pulmonary/Chest: Effort normal.  Musculoskeletal: Normal range of motion.  Neurological: She is oriented to person, place, and time.  Skin: Skin is warm and dry.  Psychiatric: She has a normal mood and affect. Her behavior is normal.  Vitals reviewed.   RECENT LABS AND TESTS: BMET  Component Value Date/Time   NA 142 04/19/2018 0929   K 4.1 04/19/2018 0929   CL 102 04/19/2018 0929   CO2 26 04/19/2018 0929   GLUCOSE 96 04/19/2018 0929   GLUCOSE 92 03/25/2018 1035   BUN 15 04/19/2018 0929   CREATININE 0.84 04/19/2018 0929   CALCIUM 9.0 04/19/2018 0929   GFRNONAA 77 04/19/2018 0929   GFRAA 89 04/19/2018 0929   Lab Results  Component Value Date   HGBA1C 6.0 (H) 04/19/2018   Lab Results  Component Value Date   INSULIN 20.9 04/19/2018   CBC    Component Value Date/Time   WBC 9.1 04/19/2018 0929   WBC 8.3 05/19/2016 0942   RBC 5.17 04/19/2018 0929   RBC 5.18 (H) 05/19/2016 0942   HGB 14.8 04/19/2018 0929   HCT 44.3 04/19/2018 0929   PLT 239.0 05/19/2016 0942   MCV 86 04/19/2018 0929    MCH 28.6 04/19/2018 0929   MCHC 33.4 04/19/2018 0929   MCHC 32.8 05/19/2016 0942   RDW 12.9 04/19/2018 0929   LYMPHSABS 2.1 04/19/2018 0929   MONOABS 0.5 05/19/2016 0942   EOSABS 0.2 04/19/2018 0929   BASOSABS 0.0 04/19/2018 0929   Iron/TIBC/Ferritin/ %Sat No results found for: IRON, TIBC, FERRITIN, IRONPCTSAT Lipid Panel     Component Value Date/Time   CHOL 141 04/19/2018 0929   TRIG 99 04/19/2018 0929   HDL 57 04/19/2018 0929   CHOLHDL 2 03/25/2018 1035   VLDL 16.4 03/25/2018 1035   LDLCALC 64 04/19/2018 0929   Hepatic Function Panel     Component Value Date/Time   PROT 6.6 04/19/2018 0929   ALBUMIN 3.5 04/19/2018 0929   AST 16 04/19/2018 0929   ALT 21 04/19/2018 0929   ALKPHOS 76 04/19/2018 0929   BILITOT 0.5 04/19/2018 0929   BILIDIR 0.1 03/30/2012 0921      Component Value Date/Time   TSH 0.870 04/19/2018 0929   TSH 1.42 03/16/2016 1643   TSH 1.37 03/30/2012 0921   Results for Kirsten, Nelson (MRN [id number]) as of 05/24/2018 13:48  Ref. Range 04/19/2018 09:29  Vitamin D, 25-Hydroxy Latest Ref Range: 30.0 - 100.0 ng/mL 14.5 (L)   ASSESSMENT AND PLAN: Vitamin D deficiency  Class 3 severe obesity due to excess calories with body mass index (BMI) of 45.0 to 49.9 in adult, unspecified whether serious comorbidity present ([place])  PLAN:  Vitamin D Deficiency Kirsten Nelson was informed that low vitamin D levels contributes to fatigue and are associated with obesity, breast, and colon cancer. She agrees to continue to take prescription Vit D @50 ,000 IU every week and will follow up for routine testing of vitamin D, at least 2-3 times per year. She was informed of the risk of over-replacement of vitamin D and agrees to not increase her dose unless she discusses this with [name] first. Kirsten Nelson agrees to follow up in 2 weeks as directed.  I spent > than 50% of the 15 minute visit on counseling as documented in the note.  Obesity Kirsten Nelson is currently in the action stage of change. As  such, her goal is to continue with weight loss efforts. She has agreed to follow the Category 2 plan and she can move protein from dinner to snack during the day. Kirsten Nelson has not been prescribed exercise at this time. We discussed the following Behavioral Modification Strategies today: holiday eating strategies and planning for success.   Siobahn has agreed to follow up with our clinic in 2 weeks. She was informed of the  importance of frequent follow up visits to maximize her success with intensive lifestyle modifications for her multiple health conditions.   OBESITY BEHAVIORAL INTERVENTION VISIT  Today's visit was # 3   Starting weight: 284 lbs Starting date: 04/19/18 Today's weight : Weight: 281 lb (127.5 kg)  Today's date: 05/17/2018 Total lbs lost to date: 3  ASK: We discussed the diagnosis of obesity with Coralee Pesa today and Taela agreed to give [name] permission to discuss obesity behavioral modification therapy today.  ASSESS: Isobel has the diagnosis of obesity and her BMI today is 46.76. Kassadi is in the action stage of change   ADVISE: Henrine was educated on the multiple health risks of obesity as well as the benefit of weight loss to improve her health. She was advised of the need for long term treatment and the importance of lifestyle modifications to improve her current health and to decrease her risk of future health problems.  AGREE: Multiple dietary modification options and treatment options were discussed and Tomisha agreed to follow the recommendations documented in the above note.  ARRANGE: Isabel was educated on the importance of frequent visits to treat obesity as outlined per CMS and USPSTF guidelines and agreed to schedule her next follow up appointment today.  Lenward Chancellor, am acting as Location manager for Georgianne Fick, FNP.  I have reviewed the above documentation for accuracy and completeness, and I agree with the above.  - Salvador Coupe, FNP-C.

## 2018-05-25 ENCOUNTER — Telehealth: Payer: Self-pay | Admitting: Family Medicine

## 2018-05-25 NOTE — Telephone Encounter (Signed)
Copied from [place] [id number]. Topic: General - Other >> May 25, 2018  3:10 PM Lennox Solders wrote:  Reason for CRM: pt is calling and she was billed her copay of 25.00 and she did not pay it. Pt was only have labs only the DOS is 03/25/18. Pt said someone call her back and took her BP she thought that's what office does before drawing her blood .  Pt bp was elevated due to fasting she must take food with her bp med. The nurse consulted with dr Nani Ravens and patient feels she should not have to pay. Pt has called [facility] department no solution

## 2018-06-02 ENCOUNTER — Ambulatory Visit (INDEPENDENT_AMBULATORY_CARE_PROVIDER_SITE_OTHER): Payer: 59 | Admitting: Family Medicine

## 2018-06-06 ENCOUNTER — Ambulatory Visit (INDEPENDENT_AMBULATORY_CARE_PROVIDER_SITE_OTHER): Payer: 59 | Admitting: Family Medicine

## 2018-06-06 ENCOUNTER — Encounter (INDEPENDENT_AMBULATORY_CARE_PROVIDER_SITE_OTHER): Payer: Self-pay | Admitting: Family Medicine

## 2018-06-06 VITALS — BP 132/79 | HR 77 | Temp 98.2°F | Ht 65.0 in | Wt 277.0 lb

## 2018-06-06 DIAGNOSIS — R7303 Prediabetes: Secondary | ICD-10-CM | POA: Diagnosis not present

## 2018-06-06 DIAGNOSIS — Z6841 Body Mass Index (BMI) 40.0 and over, adult: Secondary | ICD-10-CM

## 2018-06-06 DIAGNOSIS — Z9189 Other specified personal risk factors, not elsewhere classified: Secondary | ICD-10-CM

## 2018-06-06 DIAGNOSIS — E559 Vitamin D deficiency, unspecified: Secondary | ICD-10-CM | POA: Diagnosis not present

## 2018-06-06 MED ORDER — VITAMIN D (ERGOCALCIFEROL) 1.25 MG (50000 UNIT) PO CAPS: 50000.0000 [IU] | ORAL_CAPSULE | ORAL | 0 refills | Status: DC

## 2018-06-06 NOTE — Progress Notes (Signed)
Office: [phone]  /  Fax: [phone]   HPI:   Chief Complaint: OBESITY Kirsten Nelson is here to discuss her progress with her obesity treatment plan. She is on the Category 2 plan and is following her eating plan approximately 70 % of the time. She states she is exercising 0 minutes 0 times per week. Kirsten Nelson has been somewhat off track with the holidays, but she has followed the strategies provided by [name] for holiday eating. She is getting in all of her protein.  Her weight is 277 lb (125.6 kg) today and has had a weight loss of 4 pounds over a period of 2 weeks since her last visit. She has lost 7 lbs since starting treatment with [name].  Vitamin D deficiency Kirsten Nelson has a diagnosis of vitamin D deficiency. She is currently taking vit D, but is not at goal. Her last vitamin D level was 14.5 on 04/19/18. She admits less fatigue and denies nausea, vomiting, or muscle weakness.  Pre-Diabetes Kirsten Nelson has a diagnosis of pre-diabetes based on her elevated Hgb A1c and was informed this puts her at greater risk of developing diabetes. She is not taking metformin currently and continues to work on diet and exercise to decrease risk of diabetes. She denies polyphagia or hypoglycemia. Lab Results  Component Value Date   HGBA1C 6.0 (H) 04/19/2018  \  At risk for diabetes Kirsten Nelson is at higher than average risk for developing diabetes due to her pre-diabetes and obesity. She currently denies polyuria or polydipsia.  ASSESSMENT AND PLAN:  Vitamin D deficiency - Plan: Vitamin D, Ergocalciferol, (DRISDOL) 1.25 MG (50000 UT) CAPS capsule  Prediabetes  At risk for diabetes mellitus  Class 3 severe obesity with serious comorbidity and body mass index (BMI) of 45.0 to 49.9 in adult, unspecified obesity type ([place])  PLAN:  Vitamin D Deficiency Kirsten Nelson was informed that low vitamin D levels contributes to fatigue and are associated with obesity, breast, and colon cancer. She agrees to continue to take prescription  Vit D @50 ,000 IU every week #4 with no refills and will follow up for routine testing of vitamin D, at least 2-3 times per year. She was informed of the risk of over-replacement of vitamin D and agrees to not increase her dose unless she discusses this with [name] first. Kirsten Nelson agrees to follow up in 2 weeks.  Pre-Diabetes Kirsten Nelson will continue to work on weight loss, exercise, and decreasing simple carbohydrates in her diet to help decrease the risk of diabetes.  Kirsten Nelson agreed to follow up with [name] as directed to monitor her progress.  Diabetes risk counseling Kirsten Nelson was given extended (15 minutes) diabetes prevention counseling today. She is 57 y.o. female and has risk factors for diabetes including pre-diabetes and obesity. We discussed intensive lifestyle modifications today with an emphasis on weight loss as well as increasing exercise and decreasing simple carbohydrates in her diet.  Obesity Kirsten Nelson is currently in the action stage of change. As such, her goal is to continue with weight loss efforts. She has agreed to follow the Category 2 plan. Kirsten Nelson has not been prescribed exercise at this time. We discussed the following Behavioral Modification Strategies today: work on meal planning and easy cooking plans, holiday eating strategies, and planning for success.   Kirsten Nelson has agreed to follow up with our clinic in 2 weeks. She was informed of the importance of frequent follow up visits to maximize her success with intensive lifestyle modifications for her multiple health conditions.  ALLERGIES: Allergies  Allergen Reactions  . Ace Inhibitors Cough    MEDICATIONS: Current Outpatient Medications on File Prior to Visit  Medication Sig Dispense Refill  . BREO ELLIPTA 100-25 MCG/INH AEPB Take 1 puff by mouth daily.  2  . carvedilol (COREG) 12.5 MG tablet Take 1 tablet (12.5 mg total) by mouth 2 (two) times daily with a meal. 180 tablet 1  . levocetirizine (XYZAL) 5 MG tablet Take 5 mg by mouth daily  as needed.     . Montelukast Sodium (SINGULAIR PO) Take 10 mg by mouth daily as needed.      No current facility-administered medications on file prior to visit.     PAST MEDICAL HISTORY: Past Medical History:  Diagnosis Date  . Allergy   . Environmental allergies   . HTN (hypertension)   . Joint pain   . Obesity     PAST SURGICAL HISTORY: Past Surgical History:  Procedure Laterality Date  . TONSILLECTOMY      SOCIAL HISTORY: Social History   Tobacco Use  . Smoking status: Never Smoker  . Smokeless tobacco: Never Used  Substance Use Topics  . Alcohol use: No  . Drug use: No    FAMILY HISTORY: Family History  Problem Relation Age of Onset  . Hypertension Father   . Leukemia Father   . Lymphoma Father   . Cancer Father   . Hypertension Mother   . Cancer Mother   . Colon cancer Neg Hx   . Esophageal cancer Neg Hx   . Rectal cancer Neg Hx   . Stomach cancer Neg Hx    ROS: Review of Systems  Constitutional: Positive for malaise/fatigue and weight loss.  Gastrointestinal: Negative for nausea and vomiting.  Genitourinary:       Negative for polyuria.  Musculoskeletal:       Negative for muscle weakness.  Endo/Heme/Allergies: Negative for polydipsia.       Negative for polyphagia.   PHYSICAL EXAM: Blood pressure 132/79, pulse 77, temperature 98.2 F (36.8 C), temperature source Oral, height 5\' 5"  (1.651 m), weight 277 lb (125.6 kg), SpO2 96 %. Body mass index is 46.1 kg/m. Physical Exam Vitals signs reviewed.  Constitutional:      Appearance: Normal appearance. She is obese.  Cardiovascular:     Rate and Rhythm: Normal rate.  Pulmonary:     Effort: Pulmonary effort is normal.  Musculoskeletal: Normal range of motion.  Skin:    General: Skin is warm and dry.  Neurological:     Mental Status: She is alert and oriented to person, place, and time.  Psychiatric:        Mood and Affect: Mood normal.        Behavior: Behavior normal.     RECENT LABS  AND TESTS: BMET    Component Value Date/Time   NA 142 04/19/2018 0929   K 4.1 04/19/2018 0929   CL 102 04/19/2018 0929   CO2 26 04/19/2018 0929   GLUCOSE 96 04/19/2018 0929   GLUCOSE 92 03/25/2018 1035   BUN 15 04/19/2018 0929   CREATININE 0.84 04/19/2018 0929   CALCIUM 9.0 04/19/2018 0929   GFRNONAA 77 04/19/2018 0929   GFRAA 89 04/19/2018 0929   Lab Results  Component Value Date   HGBA1C 6.0 (H) 04/19/2018   Lab Results  Component Value Date   INSULIN 20.9 04/19/2018   CBC    Component Value Date/Time   WBC 9.1 04/19/2018 0929   WBC 8.3 05/19/2016 0942   RBC 5.17  04/19/2018 0929   RBC 5.18 (H) 05/19/2016 0942   HGB 14.8 04/19/2018 0929   HCT 44.3 04/19/2018 0929   PLT 239.0 05/19/2016 0942   MCV 86 04/19/2018 0929   MCH 28.6 04/19/2018 0929   MCHC 33.4 04/19/2018 0929   MCHC 32.8 05/19/2016 0942   RDW 12.9 04/19/2018 0929   LYMPHSABS 2.1 04/19/2018 0929   MONOABS 0.5 05/19/2016 0942   EOSABS 0.2 04/19/2018 0929   BASOSABS 0.0 04/19/2018 0929   Iron/TIBC/Ferritin/ %Sat No results found for: IRON, TIBC, FERRITIN, IRONPCTSAT Lipid Panel     Component Value Date/Time   CHOL 141 04/19/2018 0929   TRIG 99 04/19/2018 0929   HDL 57 04/19/2018 0929   CHOLHDL 2 03/25/2018 1035   VLDL 16.4 03/25/2018 1035   LDLCALC 64 04/19/2018 0929   Hepatic Function Panel     Component Value Date/Time   PROT 6.6 04/19/2018 0929   ALBUMIN 3.5 04/19/2018 0929   AST 16 04/19/2018 0929   ALT 21 04/19/2018 0929   ALKPHOS 76 04/19/2018 0929   BILITOT 0.5 04/19/2018 0929   BILIDIR 0.1 03/30/2012 0921      Component Value Date/Time   TSH 0.870 04/19/2018 0929   TSH 1.42 03/16/2016 1643   TSH 1.37 03/30/2012 0921   Results for LASHICA, HANNAY (MRN [id number]) as of 06/06/2018 15:38  Ref. Range 04/19/2018 09:29  Vitamin D, 25-Hydroxy Latest Ref Range: 30.0 - 100.0 ng/mL 14.5 (L)    OBESITY BEHAVIORAL INTERVENTION VISIT  Today's visit was # 4   Starting weight: 284  lbs Starting date: 04/19/18 Today's weight : Weight: 277 lb (125.6 kg)  Today's date: 06/06/2018 Total lbs lost to date: 7  ASK: We discussed the diagnosis of obesity with Coralee Pesa today and Leonard agreed to give [name] permission to discuss obesity behavioral modification therapy today.  ASSESS: Vona has the diagnosis of obesity and her BMI today is 46.1. Mycah is in the action stage of change.   ADVISE: Johnnie was educated on the multiple health risks of obesity as well as the benefit of weight loss to improve her health. She was advised of the need for long term treatment and the importance of lifestyle modifications to improve her current health and to decrease her risk of future health problems.  AGREE: Multiple dietary modification options and treatment options were discussed and Vicky agreed to follow the recommendations documented in the above note.  ARRANGE: Brytani was educated on the importance of frequent visits to treat obesity as outlined per CMS and USPSTF guidelines and agreed to schedule her next follow up appointment today.  I, Marcille Blanco, am acting as Location manager for [employer], FNP-C.  I have reviewed the above documentation for accuracy and completeness, and I agree with the above.  - Tou Hayner, FNP-C.

## 2018-06-07 ENCOUNTER — Encounter (INDEPENDENT_AMBULATORY_CARE_PROVIDER_SITE_OTHER): Payer: Self-pay | Admitting: Family Medicine

## 2018-06-07 ENCOUNTER — Ambulatory Visit: Payer: 59 | Admitting: Family Medicine

## 2018-06-07 ENCOUNTER — Encounter: Payer: Self-pay | Admitting: Family Medicine

## 2018-06-07 VITALS — BP 128/86 | HR 71 | Temp 98.1°F | Resp 16 | Ht 65.0 in | Wt 280.4 lb

## 2018-06-07 DIAGNOSIS — I10 Essential (primary) hypertension: Secondary | ICD-10-CM | POA: Diagnosis not present

## 2018-06-07 NOTE — Patient Instructions (Signed)

## 2018-06-07 NOTE — Progress Notes (Signed)
Patient ID: Kirsten Nelson, female    DOB: 04-Mar-1961  Age: 57 y.o. MRN: [id number]    Subjective:  Subjective  HPI Kirsten Nelson presents for bp and cholesterol.   No other complaints.  She started healthy weight and wellness.    Review of Systems  Constitutional: Negative for appetite change, diaphoresis, fatigue and unexpected weight change.  Eyes: Negative for pain, redness and visual disturbance.  Respiratory: Negative for cough, chest tightness, shortness of breath and wheezing.   Cardiovascular: Negative for chest pain, palpitations and leg swelling.  Endocrine: Negative for cold intolerance, heat intolerance, polydipsia, polyphagia and polyuria.  Genitourinary: Negative for difficulty urinating, dysuria and frequency.  Neurological: Negative for dizziness, light-headedness, numbness and headaches.    History Past Medical History:  Diagnosis Date  . Allergy   . Environmental allergies   . HTN (hypertension)   . Joint pain   . Obesity     She has a past surgical history that includes Tonsillectomy.   Her family history includes Cancer in her father and mother; Hypertension in her father and mother; Leukemia in her father; Lymphoma in her father.She reports that she has never smoked. She has never used smokeless tobacco. She reports that she does not drink alcohol or use drugs.  Current Outpatient Medications on File Prior to Visit  Medication Sig Dispense Refill  . BREO ELLIPTA 100-25 MCG/INH AEPB Take 1 puff by mouth daily.  2  . carvedilol (COREG) 12.5 MG tablet Take 1 tablet (12.5 mg total) by mouth 2 (two) times daily with a meal. 180 tablet 1  . levocetirizine (XYZAL) 5 MG tablet Take 5 mg by mouth daily as needed.     . Montelukast Sodium (SINGULAIR PO) Take 10 mg by mouth daily as needed.     . Vitamin D, Ergocalciferol, (DRISDOL) 1.25 MG (50000 UT) CAPS capsule Take 1 capsule (50,000 Units total) by mouth every 7 (seven) days. 4 capsule 0   No current  facility-administered medications on file prior to visit.      Objective:  Objective  Physical Exam Vitals signs and nursing note reviewed.  Constitutional:      Appearance: She is well-developed.  HENT:     Head: Normocephalic and atraumatic.  Eyes:     Conjunctiva/sclera: Conjunctivae normal.  Neck:     Musculoskeletal: Normal range of motion and neck supple.     Thyroid: No thyromegaly.     Vascular: No carotid bruit or JVD.  Cardiovascular:     Rate and Rhythm: Normal rate and regular rhythm.     Heart sounds: Normal heart sounds. No murmur.  Pulmonary:     Effort: Pulmonary effort is normal. No respiratory distress.     Breath sounds: Normal breath sounds. No wheezing or rales.  Chest:     Chest wall: No tenderness.  Neurological:     Mental Status: She is alert and oriented to person, place, and time.    BP 128/86 (BP Location: Right Arm, Cuff Size: Large)   Pulse 71   Temp 98.1 F (36.7 C) (Oral)   Resp 16   Ht 5\' 5"  (1.651 m)   Wt 280 lb 6.4 oz (127.2 kg)   SpO2 93%   BMI 46.66 kg/m  Wt Readings from Last 3 Encounters:  06/07/18 280 lb 6.4 oz (127.2 kg)  06/06/18 277 lb (125.6 kg)  05/17/18 281 lb (127.5 kg)     Lab Results  Component Value Date   WBC 9.1 04/19/2018  HGB 14.8 04/19/2018   HCT 44.3 04/19/2018   PLT 239.0 05/19/2016   GLUCOSE 96 04/19/2018   CHOL 141 04/19/2018   TRIG 99 04/19/2018   HDL 57 04/19/2018   LDLCALC 64 04/19/2018   ALT 21 04/19/2018   AST 16 04/19/2018   NA 142 04/19/2018   K 4.1 04/19/2018   CL 102 04/19/2018   CREATININE 0.84 04/19/2018   BUN 15 04/19/2018   CO2 26 04/19/2018   TSH 0.870 04/19/2018   HGBA1C 6.0 (H) 04/19/2018    Dg Chest 2 View  Result Date: 12/14/2016 CLINICAL DATA:  Chronic cough, worse in past 3 weeks.  Pneumonia? EXAM: CHEST  2 VIEW COMPARISON:  None. FINDINGS: Heart size is upper normal. Overall cardiomediastinal silhouette is within normal limits. Lungs are clear.  No pleural effusion  or pneumothorax. Slight elevation of the right hemidiaphragm. Osseous structures about the chest are unremarkable. IMPRESSION: No active cardiopulmonary disease. No evidence of pneumonia or pulmonary edema. Electronically Signed   By: Franki Cabot M.D.   On: 12/14/2016 10:53     Assessment & Plan:  Plan  I am having Irie L. Frith maintain her levocetirizine, Montelukast Sodium (SINGULAIR PO), BREO ELLIPTA, carvedilol, and Vitamin D (Ergocalciferol).  No orders of the defined types were placed in this encounter.   Problem List Items Addressed This Visit      Unprioritized   Essential hypertension - Primary    Well controlled, no changes to meds. Encouraged heart healthy diet such as the DASH diet and exercise as tolerated.  Labs from healthy weight and wellness reviewed         Follow-up: Return in about 6 months (around 12/07/2018).  Ann Held, DO

## 2018-06-07 NOTE — Assessment & Plan Note (Signed)
Well controlled, no changes to meds. Encouraged heart healthy diet such as the DASH diet and exercise as tolerated.  Labs from healthy weight and wellness reviewed

## 2018-06-08 NOTE — Telephone Encounter (Signed)
Hey,  I think it is different for different insurance policies.  Thanks,  [employer]

## 2018-06-08 NOTE — Telephone Encounter (Signed)
HI Larene Beach,   There was an order from Dr. Etter Sjogren on 9/17  for her to come in and have her BP check again.  This is a nurse visit and Dr. Nani Ravens signed off on it.  It is documented correctly and the charge is correct.  Even had her BP not been elevated there would have been a charge. Let me know if further questions. Thanks, [employer]

## 2018-06-20 DIAGNOSIS — R05 Cough: Secondary | ICD-10-CM | POA: Diagnosis not present

## 2018-06-20 DIAGNOSIS — J301 Allergic rhinitis due to pollen: Secondary | ICD-10-CM | POA: Diagnosis not present

## 2018-06-20 DIAGNOSIS — J3089 Other allergic rhinitis: Secondary | ICD-10-CM | POA: Diagnosis not present

## 2018-06-20 DIAGNOSIS — J3081 Allergic rhinitis due to animal (cat) (dog) hair and dander: Secondary | ICD-10-CM | POA: Diagnosis not present

## 2018-06-28 ENCOUNTER — Encounter (INDEPENDENT_AMBULATORY_CARE_PROVIDER_SITE_OTHER): Payer: Self-pay | Admitting: Family Medicine

## 2018-06-28 ENCOUNTER — Ambulatory Visit (INDEPENDENT_AMBULATORY_CARE_PROVIDER_SITE_OTHER): Payer: 59 | Admitting: Family Medicine

## 2018-06-28 VITALS — BP 154/96 | HR 62 | Temp 97.8°F | Ht 65.0 in | Wt 271.0 lb

## 2018-06-28 DIAGNOSIS — R7303 Prediabetes: Secondary | ICD-10-CM | POA: Diagnosis not present

## 2018-06-28 DIAGNOSIS — Z6841 Body Mass Index (BMI) 40.0 and over, adult: Secondary | ICD-10-CM

## 2018-06-29 ENCOUNTER — Encounter (INDEPENDENT_AMBULATORY_CARE_PROVIDER_SITE_OTHER): Payer: Self-pay | Admitting: Family Medicine

## 2018-06-29 DIAGNOSIS — R7303 Prediabetes: Secondary | ICD-10-CM | POA: Insufficient documentation

## 2018-06-29 DIAGNOSIS — Z6841 Body Mass Index (BMI) 40.0 and over, adult: Secondary | ICD-10-CM | POA: Insufficient documentation

## 2018-06-29 NOTE — Progress Notes (Signed)
Office: [phone]  /  Fax: [phone]   HPI:   Chief Complaint: OBESITY Kirsten Nelson is here to discuss her progress with her obesity treatment plan. She is on the Category 2 plan and is following her eating plan approximately 50% of the time. She states she is exercising 0 minutes 0 times per week. Kirsten Nelson has had an upper respiratory infection with laryngitis. Her appetite has been decreased and she has not been eating all of the food prescribed on her plan.  Her weight is 271 lb (122.9 kg) today and has had a weight loss of 6 pounds over a period of 3 weeks since her last visit. She has lost 13 lbs since starting treatment with [name].  Pre-Diabetes Kirsten Nelson has a diagnosis of pre-diabetes based on her elevated Hgb A1c and was informed this puts her at greater risk of developing diabetes. She is not taking metformin currently and continues to work on diet and exercise to decrease risk of diabetes. She denies polyphagia or hypoglycemia. Lab Results  Component Value Date   HGBA1C 6.0 (H) 04/19/2018     ALLERGIES: Allergies  Allergen Reactions  . Ace Inhibitors Cough    MEDICATIONS: Current Outpatient Medications on File Prior to Visit  Medication Sig Dispense Refill  . BREO ELLIPTA 100-25 MCG/INH AEPB Take 1 puff by mouth daily.  2  . carvedilol (COREG) 12.5 MG tablet Take 1 tablet (12.5 mg total) by mouth 2 (two) times daily with a meal. 180 tablet 1  . levocetirizine (XYZAL) 5 MG tablet Take 5 mg by mouth daily as needed.     . Montelukast Sodium (SINGULAIR PO) Take 10 mg by mouth daily as needed.     . Vitamin D, Ergocalciferol, (DRISDOL) 1.25 MG (50000 UT) CAPS capsule Take 1 capsule (50,000 Units total) by mouth every 7 (seven) days. 4 capsule 0   No current facility-administered medications on file prior to visit.     PAST MEDICAL HISTORY: Past Medical History:  Diagnosis Date  . Allergy   . Environmental allergies   . HTN (hypertension)   . Joint pain   . Obesity      PAST SURGICAL HISTORY: Past Surgical History:  Procedure Laterality Date  . TONSILLECTOMY      SOCIAL HISTORY: Social History   Tobacco Use  . Smoking status: Never Smoker  . Smokeless tobacco: Never Used  Substance Use Topics  . Alcohol use: No  . Drug use: No    FAMILY HISTORY: Family History  Problem Relation Age of Onset  . Hypertension Father   . Leukemia Father   . Lymphoma Father   . Cancer Father   . Hypertension Mother   . Cancer Mother   . Colon cancer Neg Hx   . Esophageal cancer Neg Hx   . Rectal cancer Neg Hx   . Stomach cancer Neg Hx     ROS: Review of Systems  Constitutional: Positive for weight loss.  Endo/Heme/Allergies:       Negative polyphagia Negative hypoglycemia    PHYSICAL EXAM: Blood pressure (!) 154/96, pulse 62, temperature 97.8 F (36.6 C), temperature source Oral, height 5\' 5"  (1.651 m), weight 271 lb (122.9 kg), SpO2 95 %. Body mass index is 45.1 kg/m. Physical Exam Vitals signs reviewed.  Constitutional:      Appearance: Normal appearance. She is obese.  Cardiovascular:     Rate and Rhythm: Normal rate.     Pulses: Normal pulses.  Pulmonary:     Effort: Pulmonary effort  is normal.  Musculoskeletal: Normal range of motion.  Skin:    General: Skin is warm and dry.  Neurological:     Mental Status: She is alert and oriented to person, place, and time.  Psychiatric:        Mood and Affect: Mood normal.        Behavior: Behavior normal.     RECENT LABS AND TESTS: BMET    Component Value Date/Time   NA 142 04/19/2018 0929   K 4.1 04/19/2018 0929   CL 102 04/19/2018 0929   CO2 26 04/19/2018 0929   GLUCOSE 96 04/19/2018 0929   GLUCOSE 92 03/25/2018 1035   BUN 15 04/19/2018 0929   CREATININE 0.84 04/19/2018 0929   CALCIUM 9.0 04/19/2018 0929   GFRNONAA 77 04/19/2018 0929   GFRAA 89 04/19/2018 0929   Lab Results  Component Value Date   HGBA1C 6.0 (H) 04/19/2018   Lab Results  Component Value Date    INSULIN 20.9 04/19/2018   CBC    Component Value Date/Time   WBC 9.1 04/19/2018 0929   WBC 8.3 05/19/2016 0942   RBC 5.17 04/19/2018 0929   RBC 5.18 (H) 05/19/2016 0942   HGB 14.8 04/19/2018 0929   HCT 44.3 04/19/2018 0929   PLT 239.0 05/19/2016 0942   MCV 86 04/19/2018 0929   MCH 28.6 04/19/2018 0929   MCHC 33.4 04/19/2018 0929   MCHC 32.8 05/19/2016 0942   RDW 12.9 04/19/2018 0929   LYMPHSABS 2.1 04/19/2018 0929   MONOABS 0.5 05/19/2016 0942   EOSABS 0.2 04/19/2018 0929   BASOSABS 0.0 04/19/2018 0929   Iron/TIBC/Ferritin/ %Sat No results found for: IRON, TIBC, FERRITIN, IRONPCTSAT Lipid Panel     Component Value Date/Time   CHOL 141 04/19/2018 0929   TRIG 99 04/19/2018 0929   HDL 57 04/19/2018 0929   CHOLHDL 2 03/25/2018 1035   VLDL 16.4 03/25/2018 1035   LDLCALC 64 04/19/2018 0929   Hepatic Function Panel     Component Value Date/Time   PROT 6.6 04/19/2018 0929   ALBUMIN 3.5 04/19/2018 0929   AST 16 04/19/2018 0929   ALT 21 04/19/2018 0929   ALKPHOS 76 04/19/2018 0929   BILITOT 0.5 04/19/2018 0929   BILIDIR 0.1 03/30/2012 0921      Component Value Date/Time   TSH 0.870 04/19/2018 0929   TSH 1.42 03/16/2016 1643   TSH 1.37 03/30/2012 0921    ASSESSMENT AND PLAN: Prediabetes  Class 3 severe obesity with serious comorbidity and body mass index (BMI) of 45.0 to 49.9 in adult, unspecified obesity type [facility])  PLAN:  Pre-Diabetes Kirsten Nelson will continue her meal plan, and will continue to work on weight loss, exercise, and decreasing simple carbohydrates in her diet to help decrease the risk of diabetes. We dicussed metformin including benefits and risks. Kirsten Nelson declined metformin for now and a prescription was not written today. Kirsten Nelson agrees to follow up with our clinic in 2 weeks as directed to monitor her progress.  I spent > than 50% of the 15 minute visit on counseling as documented in the note.  Obesity Kirsten Nelson is currently in the action stage of  change. As such, her goal is to continue with weight loss efforts She has agreed to follow the Category 2 plan Kirsten Nelson has not been prescribed exercise at this time. We discussed the following Behavioral Modification Strategies today: increasing lean protein intake, increase H20 intake, and planning for success   Kirsten Nelson has agreed to follow up with our  clinic in 2 weeks. She was informed of the importance of frequent follow up visits to maximize her success with intensive lifestyle modifications for her multiple health conditions.   OBESITY BEHAVIORAL INTERVENTION VISIT  Today's visit was # 5  Starting weight: 284 lbs Starting date: 04/19/18 Today's weight : 271 lbs Today's date: 06/28/2018 Total lbs lost to date: 89  ASK: We discussed the diagnosis of obesity with Kirsten Nelson today and Kirsten Nelson agreed to give [name] permission to discuss obesity behavioral modification therapy today.  ASSESS: Kirsten Nelson has the diagnosis of obesity and her BMI today is 45.11 Kirsten Nelson is in the action stage of change   ADVISE: Kirsten Nelson was educated on the multiple health risks of obesity as well as the benefit of weight loss to improve her health. She was advised of the need for long term treatment and the importance of lifestyle modifications to improve her current health and to decrease her risk of future health problems.  AGREE: Multiple dietary modification options and treatment options were discussed and  Kirsten Nelson agreed to follow the recommendations documented in the above note.  ARRANGE: Kirsten Nelson was educated on the importance of frequent visits to treat obesity as outlined per CMS and USPSTF guidelines and agreed to schedule her next follow up appointment today.  Wilhemena Durie, am acting as Location manager for [employer], FNP-C.  I have reviewed the above documentation for accuracy and completeness, and I agree with the above.  - Dawn Whitmire, FNP-C.

## 2018-07-12 ENCOUNTER — Encounter (INDEPENDENT_AMBULATORY_CARE_PROVIDER_SITE_OTHER): Payer: Self-pay | Admitting: Family Medicine

## 2018-07-12 ENCOUNTER — Ambulatory Visit (INDEPENDENT_AMBULATORY_CARE_PROVIDER_SITE_OTHER): Payer: 59 | Admitting: Family Medicine

## 2018-07-12 VITALS — BP 151/91 | HR 64 | Temp 97.9°F | Ht 65.0 in | Wt 273.0 lb

## 2018-07-12 DIAGNOSIS — I10 Essential (primary) hypertension: Secondary | ICD-10-CM

## 2018-07-12 DIAGNOSIS — E559 Vitamin D deficiency, unspecified: Secondary | ICD-10-CM | POA: Diagnosis not present

## 2018-07-12 DIAGNOSIS — Z6841 Body Mass Index (BMI) 40.0 and over, adult: Secondary | ICD-10-CM

## 2018-07-12 DIAGNOSIS — Z9189 Other specified personal risk factors, not elsewhere classified: Secondary | ICD-10-CM

## 2018-07-12 NOTE — Progress Notes (Signed)
Office: [phone]  /  Fax: [phone]   HPI:   Chief Complaint: OBESITY Kirsten Nelson is here to discuss her progress with her obesity treatment plan. She is on the Category 2 plan and is following her eating plan approximately 75% of the time. She states she is exercising 0 minutes 0 times per week. Kirsten Nelson has been sick with an upper respiratory infection and has been off track.. She is trying to get back on track with the plan. She does not always eat the vegetables on the plan. She denies excessive hunger.  Her weight is 273 lb (123.8 kg) today and has gained 2 pounds since her last visit. She has lost 11 lbs since starting treatment with [name].  Vitamin D Deficiency Kirsten Nelson has a diagnosis of vitamin D deficiency. She is currently taking prescription Vit D, but level is not at goal. Last Vit D level was 14.5 on 04/19/18. She notes mild fatigue and denies nausea, vomiting or muscle weakness.  Hypertension Kirsten Nelson is a 58 y.o. female with hypertension. [name]'s blood pressure is not well controlled. She is on carvedilol 12.5 mg BID, but she usually only takes it once daily. She denies chest pain or shortness of breath. She is working weight loss to help control her blood pressure with the goal of decreasing her risk of heart attack and stroke.   At risk for cardiovascular disease Kirsten Nelson is at a higher than average risk for cardiovascular disease due to obesity and hypertension. She currently denies any chest pain.  ALLERGIES: Allergies  Allergen Reactions  . Ace Inhibitors Cough    MEDICATIONS: Current Outpatient Medications on File Prior to Visit  Medication Sig Dispense Refill  . BREO ELLIPTA 100-25 MCG/INH AEPB Take 1 puff by mouth daily.  2  . carvedilol (COREG) 12.5 MG tablet Take 1 tablet (12.5 mg total) by mouth 2 (two) times daily with a meal. 180 tablet 1  . levocetirizine (XYZAL) 5 MG tablet Take 5 mg by mouth daily as needed.     . Montelukast Sodium (SINGULAIR PO) Take 10  mg by mouth daily as needed.     . Vitamin D, Ergocalciferol, (DRISDOL) 1.25 MG (50000 UT) CAPS capsule Take 1 capsule (50,000 Units total) by mouth every 7 (seven) days. 4 capsule 0   No current facility-administered medications on file prior to visit.     PAST MEDICAL HISTORY: Past Medical History:  Diagnosis Date  . Allergy   . Environmental allergies   . HTN (hypertension)   . Joint pain   . Obesity     PAST SURGICAL HISTORY: Past Surgical History:  Procedure Laterality Date  . TONSILLECTOMY      SOCIAL HISTORY: Social History   Tobacco Use  . Smoking status: Never Smoker  . Smokeless tobacco: Never Used  Substance Use Topics  . Alcohol use: No  . Drug use: No    FAMILY HISTORY: Family History  Problem Relation Age of Onset  . Hypertension Father   . Leukemia Father   . Lymphoma Father   . Cancer Father   . Hypertension Mother   . Cancer Mother   . Colon cancer Neg Hx   . Esophageal cancer Neg Hx   . Rectal cancer Neg Hx   . Stomach cancer Neg Hx     ROS: Review of Systems  Constitutional: Positive for malaise/fatigue. Negative for weight loss.  Respiratory: Negative for shortness of breath.   Cardiovascular: Negative for chest pain.  Gastrointestinal: Negative for  nausea and vomiting.  Musculoskeletal:       Negative muscle weakness    PHYSICAL EXAM: Blood pressure (!) 151/91, pulse 64, temperature 97.9 F (36.6 C), temperature source Oral, height 5\' 5"  (1.651 m), weight 273 lb (123.8 kg), SpO2 97 %. Body mass index is 45.43 kg/m. Physical Exam Vitals signs reviewed.  Constitutional:      Appearance: Normal appearance. She is obese.  Cardiovascular:     Rate and Rhythm: Normal rate.     Pulses: Normal pulses.  Pulmonary:     Effort: Pulmonary effort is normal.     Breath sounds: Normal breath sounds.  Musculoskeletal: Normal range of motion.  Skin:    General: Skin is warm and dry.  Neurological:     Mental Status: She is alert and  oriented to person, place, and time.  Psychiatric:        Mood and Affect: Mood normal.        Behavior: Behavior normal.     RECENT LABS AND TESTS: BMET    Component Value Date/Time   NA 142 04/19/2018 0929   K 4.1 04/19/2018 0929   CL 102 04/19/2018 0929   CO2 26 04/19/2018 0929   GLUCOSE 96 04/19/2018 0929   GLUCOSE 92 03/25/2018 1035   BUN 15 04/19/2018 0929   CREATININE 0.84 04/19/2018 0929   CALCIUM 9.0 04/19/2018 0929   GFRNONAA 77 04/19/2018 0929   GFRAA 89 04/19/2018 0929   Lab Results  Component Value Date   HGBA1C 6.0 (H) 04/19/2018   Lab Results  Component Value Date   INSULIN 20.9 04/19/2018   CBC    Component Value Date/Time   WBC 9.1 04/19/2018 0929   WBC 8.3 05/19/2016 0942   RBC 5.17 04/19/2018 0929   RBC 5.18 (H) 05/19/2016 0942   HGB 14.8 04/19/2018 0929   HCT 44.3 04/19/2018 0929   PLT 239.0 05/19/2016 0942   MCV 86 04/19/2018 0929   MCH 28.6 04/19/2018 0929   MCHC 33.4 04/19/2018 0929   MCHC 32.8 05/19/2016 0942   RDW 12.9 04/19/2018 0929   LYMPHSABS 2.1 04/19/2018 0929   MONOABS 0.5 05/19/2016 0942   EOSABS 0.2 04/19/2018 0929   BASOSABS 0.0 04/19/2018 0929   Iron/TIBC/Ferritin/ %Sat No results found for: IRON, TIBC, FERRITIN, IRONPCTSAT Lipid Panel     Component Value Date/Time   CHOL 141 04/19/2018 0929   TRIG 99 04/19/2018 0929   HDL 57 04/19/2018 0929   CHOLHDL 2 03/25/2018 1035   VLDL 16.4 03/25/2018 1035   LDLCALC 64 04/19/2018 0929   Hepatic Function Panel     Component Value Date/Time   PROT 6.6 04/19/2018 0929   ALBUMIN 3.5 04/19/2018 0929   AST 16 04/19/2018 0929   ALT 21 04/19/2018 0929   ALKPHOS 76 04/19/2018 0929   BILITOT 0.5 04/19/2018 0929   BILIDIR 0.1 03/30/2012 0921      Component Value Date/Time   TSH 0.870 04/19/2018 0929   TSH 1.42 03/16/2016 1643   TSH 1.37 03/30/2012 0921    ASSESSMENT AND PLAN: Vitamin D deficiency - Plan: Vitamin D, Ergocalciferol, (DRISDOL) 1.25 MG (50000 UT) CAPS  capsule  Essential hypertension  At risk for heart disease  Class 3 severe obesity with serious comorbidity and body mass index (BMI) of 45.0 to 49.9 in adult, unspecified obesity type (HCC)  PLAN:  Vitamin D Deficiency Kirsten Nelson was informed that low vitamin D levels contributes to fatigue and are associated with obesity, breast, and colon cancer. Kirsten Nelson  agrees to continue taking prescription Vit D @50 ,000 IU every week #4 and we will refill for 1 month. She will follow up for routine testing of vitamin D, at least 2-3 times per year. She was informed of the risk of over-replacement of vitamin D and agrees to not increase her dose unless she discusses this with [name] first. We will recheck Vit D level at next visit. Kirsten Nelson agrees to follow up with our clinic in 2 weeks.  Hypertension We discussed sodium restriction, working on healthy weight loss, and a regular exercise program as the means to achieve improved blood pressure control. Kirsten Nelson agreed with this plan and agreed to follow up as directed. We will continue to monitor her blood pressure as well as her progress with the above lifestyle modifications. Kirsten Nelson agrees to continue her medications, she was advised to take carvedilol BID. She will watch for signs of hypotension as she continues her lifestyle modifications. Kirsten Nelson agrees to follow up with our clinic in 2 weeks.  Cardiovascular risk counselling Kirsten Nelson was given extended (15 minutes) coronary artery disease prevention counseling today. She is 58 y.o. female and has risk factors for heart disease including obesity and hypertension. We discussed intensive lifestyle modifications today with an emphasis on specific weight loss instructions and strategies. Pt was also informed of the importance of increasing exercise and decreasing saturated fats to help prevent heart disease.  Obesity Kirsten Nelson is currently in the action stage of change. As such, her goal is to continue with weight loss efforts She  has agreed to follow the Category 2 plan Kirsten Nelson has not been prescribed exercise at this time. We discussed the following Behavioral Modification Strategies today: increasing lean protein intake, increasing vegetables, better snacking choices, and planning for success   Kirsten Nelson has agreed to follow up with our clinic in 2 weeks. She was informed of the importance of frequent follow up visits to maximize her success with intensive lifestyle modifications for her multiple health conditions.   OBESITY BEHAVIORAL INTERVENTION VISIT  Today's visit was # 6  Starting weight: 284 lbs Starting date: 04/19/18 Today's weight : 273 lbs Today's date: 07/12/2018 Total lbs lost to date: 30    ASK: We discussed the diagnosis of obesity with Kirsten Nelson today and Kirsten Nelson agreed to give [name] permission to discuss obesity behavioral modification therapy today.  ASSESS: Kirsten Nelson has the diagnosis of obesity and her BMI today is 45.43 Kirsten Nelson is in the action stage of change   ADVISE: Kirsten Nelson was educated on the multiple health risks of obesity as well as the benefit of weight loss to improve her health. She was advised of the need for long term treatment and the importance of lifestyle modifications to improve her current health and to decrease her risk of future health problems.  AGREE: Multiple dietary modification options and treatment options were discussed and Kirsten Nelson agreed to follow the recommendations documented in the above note.  ARRANGE: Kirsten Nelson was educated on the importance of frequent visits to treat obesity as outlined per CMS and USPSTF guidelines and agreed to schedule her next follow up appointment today.  Kirsten Nelson, am acting as Location manager for [employer], FNP-C.  I have reviewed the above documentation for accuracy and completeness, and I agree with the above.  - Zaydon Kinser, FNP-C.

## 2018-07-13 ENCOUNTER — Encounter (INDEPENDENT_AMBULATORY_CARE_PROVIDER_SITE_OTHER): Payer: Self-pay | Admitting: Family Medicine

## 2018-07-13 DIAGNOSIS — E559 Vitamin D deficiency, unspecified: Secondary | ICD-10-CM | POA: Insufficient documentation

## 2018-07-13 MED ORDER — VITAMIN D (ERGOCALCIFEROL) 1.25 MG (50000 UNIT) PO CAPS: 50000.0000 [IU] | ORAL_CAPSULE | ORAL | 0 refills | Status: DC

## 2018-07-31 ENCOUNTER — Other Ambulatory Visit (INDEPENDENT_AMBULATORY_CARE_PROVIDER_SITE_OTHER): Payer: Self-pay | Admitting: Family Medicine

## 2018-07-31 DIAGNOSIS — E559 Vitamin D deficiency, unspecified: Secondary | ICD-10-CM

## 2018-08-03 DIAGNOSIS — J301 Allergic rhinitis due to pollen: Secondary | ICD-10-CM | POA: Diagnosis not present

## 2018-08-03 DIAGNOSIS — J3081 Allergic rhinitis due to animal (cat) (dog) hair and dander: Secondary | ICD-10-CM | POA: Diagnosis not present

## 2018-08-03 DIAGNOSIS — J3089 Other allergic rhinitis: Secondary | ICD-10-CM | POA: Diagnosis not present

## 2018-08-03 DIAGNOSIS — R05 Cough: Secondary | ICD-10-CM | POA: Diagnosis not present

## 2018-08-04 ENCOUNTER — Ambulatory Visit (INDEPENDENT_AMBULATORY_CARE_PROVIDER_SITE_OTHER): Payer: 59 | Admitting: Family Medicine

## 2018-08-04 ENCOUNTER — Encounter (INDEPENDENT_AMBULATORY_CARE_PROVIDER_SITE_OTHER): Payer: Self-pay | Admitting: Family Medicine

## 2018-08-04 VITALS — BP 150/91 | HR 74 | Temp 97.7°F | Ht 65.0 in | Wt 281.0 lb

## 2018-08-04 DIAGNOSIS — E559 Vitamin D deficiency, unspecified: Secondary | ICD-10-CM | POA: Diagnosis not present

## 2018-08-04 DIAGNOSIS — I10 Essential (primary) hypertension: Secondary | ICD-10-CM | POA: Diagnosis not present

## 2018-08-04 DIAGNOSIS — Z9189 Other specified personal risk factors, not elsewhere classified: Secondary | ICD-10-CM | POA: Diagnosis not present

## 2018-08-04 DIAGNOSIS — R7303 Prediabetes: Secondary | ICD-10-CM | POA: Diagnosis not present

## 2018-08-04 DIAGNOSIS — E7849 Other hyperlipidemia: Secondary | ICD-10-CM | POA: Diagnosis not present

## 2018-08-04 DIAGNOSIS — Z6841 Body Mass Index (BMI) 40.0 and over, adult: Secondary | ICD-10-CM

## 2018-08-04 MED ORDER — VITAMIN D (ERGOCALCIFEROL) 1.25 MG (50000 UNIT) PO CAPS: 50000.0000 [IU] | ORAL_CAPSULE | ORAL | 0 refills | Status: DC

## 2018-08-04 NOTE — Progress Notes (Signed)
Office: [phone]  /  Fax: [phone]   HPI:   Chief Complaint: OBESITY Kirsten Nelson is here to discuss her progress with her obesity treatment plan. She is on the  follow the Category 2 plan and is following her eating plan approximately 50 % of the time. She states she is exercising 0 minutes 0 times per week. Kirsten Nelson had multiple birthday celebrations recently and is off track. She has several more celebrations to come. She has increased protein and water intake.  Her weight is 281 lb (127.5 kg) today and has gained 8 lbs  since her last visit. She has lost 3 lbs since starting treatment with [name].  Vitamin D deficiency Kirsten Nelson has a diagnosis of vitamin D deficiency. She is currently taking prescription Vit D and denies nausea, vomiting or muscle weakness. She is not at goal. Her last Vit D level was 14.5 on 04/19/2018. Kirsten Nelson states that fatigue has reduced since starting prescription Vit D.  Pre-Diabetes Kirsten Nelson has a diagnosis of prediabetes based on her elevated Hgb A1c and was informed this puts her at greater risk of developing diabetes. She is not taking metformin currently and continues to work on diet and exercise to decrease risk of diabetes. She denies nausea or hypoglycemia. Her last A1C was 6.0. She denies polyphagia.  Hyperlipidemia Kirsten Nelson has hyperlipidemia and has been trying to improve her cholesterol levels with intensive lifestyle modification including a low saturated fat diet, exercise and weight loss. Kirsten Nelson is not on a stain. She denies any chest pain or shortness of breath.  Hypertension Kirsten Nelson is a 58 y.o. female with hypertension.  Kirsten Nelson denies chest pain or shortness of breath.She is working weight loss to help control her blood pressure with the goal of decreasing her risk of heart attack and stroke. Kirsten Nelson blood pressure is elevated today. She has not taken her blood pressure medication this morning because she is fasting.  At risk for osteopenia and  osteoporosis Kirsten Nelson is at higher risk of osteopenia and osteoporosis due to vitamin D deficiency.    ASSESSMENT AND PLAN:  Vitamin D deficiency - Plan: VITAMIN D 25 Hydroxy (Vit-D Deficiency, Fractures), Vitamin D, Ergocalciferol, (DRISDOL) 1.25 MG (50000 UT) CAPS capsule  Prediabetes - Plan: Comprehensive metabolic panel, Hemoglobin A1c, Insulin, random  Other hyperlipidemia - Plan: Lipid Panel With LDL/HDL Ratio  Essential hypertension  At risk for osteoporosis  Class 3 severe obesity with serious comorbidity and body mass index (BMI) of 45.0 to 49.9 in adult, unspecified obesity type ([place])  PLAN:  Vitamin D Deficiency Kirsten Nelson was informed that low vitamin D levels contributes to fatigue and are associated with obesity, breast, and colon cancer. Kirsten Nelson agrees to continue taking prescription Vit D 50,000 IU every week #4 with no refills and will follow up for routine testing of vitamin D, at least 2-3 times per year. She was informed of the risk of over-replacement of vitamin D and agrees to not increase her dose unless she discusses this with [name] first. We will check labs today. Kirsten Nelson agrees to follow up with our clinic in 3 weeks.  Pre-Diabetes Kirsten Nelson will continue to work on weight loss, exercise, and decreasing simple carbohydrates in her diet to help decrease the risk of diabetes. We dicussed metformin including benefits and risks. She was informed that eating too many simple carbohydrates or too many calories at one sitting increases the likelihood of GI side effects. Kirsten Nelson declined metformin for now and a prescription was not written today.  Kirsten Nelson agreed to follow up with [name] as directed to monitor her progress. We will check labs today. Kirsten Nelson agrees to follow up with our clinic in 3 weeks.  Hyperlipidemia Kirsten Nelson was informed of the American Heart Association Guidelines emphasizing intensive lifestyle modifications as the first line treatment for hyperlipidemia. We discussed many lifestyle  modifications today in depth, and Kirsten Nelson will continue to work on decreasing saturated fats such as fatty red meat, butter and many fried foods. She will also increase vegetables and lean protein in her diet and continue to work on exercise and weight loss efforts. We will check labs today. Kirsten Nelson agrees to follow up with our clinic in 3 weeks.  Hypertension We discussed sodium restriction, working on healthy weight loss, and a regular exercise program as the means to achieve improved blood pressure control. Kirsten Nelson agreed with this plan and agreed to follow up as directed. We will continue to monitor her blood pressure as well as her progress with the above lifestyle modifications. She will continue her medications and will watch for signs of hypotension as she continues her lifestyle modifications. We will check labs today. Kirsten Nelson agrees to follow up with our clinic in 3 weeks.  At risk for osteopenia and osteoporosis Kirsten Nelson was given extended  (15 minutes) osteoporosis prevention counseling today. Kirsten Nelson is at risk for osteopenia and osteoporosis due to her vitamin D deficiency. She was encouraged to take her vitamin D and follow her higher calcium diet and increase strengthening exercise to help strengthen her bones and decrease her risk of osteopenia and osteoporosis.  Obesity Kirsten Nelson is currently in the action stage of change. As such, her goal is to continue with weight loss efforts She has agreed to follow the Category 2 plan Kirsten Nelson has not been prescribed exercise. We discussed the following Behavioral Modification Strategies today: increasing lean protein intake, decreasing simple carbohydrates and planning for success Kirsten Nelson plans to start writing down what she is eating, which has helped in the past. Kirsten Nelson has agreed to follow up with our clinic in 3 weeks. She was informed of the importance of frequent follow up visits to maximize her success with intensive lifestyle modifications for her multiple  health conditions.  ALLERGIES: Allergies  Allergen Reactions  . Ace Inhibitors Cough    MEDICATIONS: Current Outpatient Medications on File Prior to Visit  Medication Sig Dispense Refill  . BREO ELLIPTA 100-25 MCG/INH AEPB Take 1 puff by mouth daily.  2  . carvedilol (COREG) 12.5 MG tablet Take 1 tablet (12.5 mg total) by mouth 2 (two) times daily with a meal. 180 tablet 1  . levocetirizine (XYZAL) 5 MG tablet Take 5 mg by mouth daily as needed.     . Montelukast Sodium (SINGULAIR PO) Take 10 mg by mouth daily as needed.      No current facility-administered medications on file prior to visit.     PAST MEDICAL HISTORY: Past Medical History:  Diagnosis Date  . Allergy   . Environmental allergies   . HTN (hypertension)   . Joint pain   . Obesity     PAST SURGICAL HISTORY: Past Surgical History:  Procedure Laterality Date  . TONSILLECTOMY      SOCIAL HISTORY: Social History   Tobacco Use  . Smoking status: Never Smoker  . Smokeless tobacco: Never Used  Substance Use Topics  . Alcohol use: No  . Drug use: No    FAMILY HISTORY: Family History  Problem Relation Age of Onset  . Hypertension Father   .  Leukemia Father   . Lymphoma Father   . Cancer Father   . Hypertension Mother   . Cancer Mother   . Colon cancer Neg Hx   . Esophageal cancer Neg Hx   . Rectal cancer Neg Hx   . Stomach cancer Neg Hx     ROS: Review of Systems  Constitutional: Negative for weight loss.  Respiratory: Negative for shortness of breath.   Cardiovascular: Negative for chest pain.  Gastrointestinal: Negative for nausea and vomiting.  Genitourinary:       Negative for polyuria  Musculoskeletal:       Negative for muscle weakness  Endo/Heme/Allergies:       Negative for hypoglycemia Negative for polyphagia    PHYSICAL EXAM: Blood pressure (!) 150/91, pulse 74, temperature 97.7 F (36.5 C), temperature source Oral, height 5\' 5"  (1.651 m), weight 281 lb (127.5 kg), SpO2 95  %. Body mass index is 46.76 kg/m. Physical Exam Vitals signs reviewed.  Constitutional:      Appearance: Normal appearance. She is obese.  Cardiovascular:     Rate and Rhythm: Normal rate.     Pulses: Normal pulses.  Pulmonary:     Effort: Pulmonary effort is normal.  Musculoskeletal: Normal range of motion.  Skin:    General: Skin is warm and dry.  Neurological:     Mental Status: She is alert and oriented to person, place, and time.  Psychiatric:        Mood and Affect: Mood normal.        Behavior: Behavior normal.     RECENT LABS AND TESTS: BMET    Component Value Date/Time   NA 142 04/19/2018 0929   K 4.1 04/19/2018 0929   CL 102 04/19/2018 0929   CO2 26 04/19/2018 0929   GLUCOSE 96 04/19/2018 0929   GLUCOSE 92 03/25/2018 1035   BUN 15 04/19/2018 0929   CREATININE 0.84 04/19/2018 0929   CALCIUM 9.0 04/19/2018 0929   GFRNONAA 77 04/19/2018 0929   GFRAA 89 04/19/2018 0929   Lab Results  Component Value Date   HGBA1C 6.0 (H) 04/19/2018   Lab Results  Component Value Date   INSULIN 20.9 04/19/2018   CBC    Component Value Date/Time   WBC 9.1 04/19/2018 0929   WBC 8.3 05/19/2016 0942   RBC 5.17 04/19/2018 0929   RBC 5.18 (H) 05/19/2016 0942   HGB 14.8 04/19/2018 0929   HCT 44.3 04/19/2018 0929   PLT 239.0 05/19/2016 0942   MCV 86 04/19/2018 0929   MCH 28.6 04/19/2018 0929   MCHC 33.4 04/19/2018 0929   MCHC 32.8 05/19/2016 0942   RDW 12.9 04/19/2018 0929   LYMPHSABS 2.1 04/19/2018 0929   MONOABS 0.5 05/19/2016 0942   EOSABS 0.2 04/19/2018 0929   BASOSABS 0.0 04/19/2018 0929   Iron/TIBC/Ferritin/ %Sat No results found for: IRON, TIBC, FERRITIN, IRONPCTSAT Lipid Panel     Component Value Date/Time   CHOL 141 04/19/2018 0929   TRIG 99 04/19/2018 0929   HDL 57 04/19/2018 0929   CHOLHDL 2 03/25/2018 1035   VLDL 16.4 03/25/2018 1035   LDLCALC 64 04/19/2018 0929   Hepatic Function Panel     Component Value Date/Time   PROT 6.6 04/19/2018 0929    ALBUMIN 3.5 04/19/2018 0929   AST 16 04/19/2018 0929   ALT 21 04/19/2018 0929   ALKPHOS 76 04/19/2018 0929   BILITOT 0.5 04/19/2018 0929   BILIDIR 0.1 03/30/2012 0921      Component Value  Date/Time   TSH 0.870 04/19/2018 0929   TSH 1.42 03/16/2016 1643   TSH 1.37 03/30/2012 0921     Ref. Range 04/19/2018 09:29  Vitamin D, 25-Hydroxy Latest Ref Range: 30.0 - 100.0 ng/mL 14.5 (L)     OBESITY BEHAVIORAL INTERVENTION VISIT  Today's visit was # 7   Starting weight: 284 lbs Starting date: 04/19/2018 Today's weight :281 lbs Today's date: 08/04/2018 Total lbs lost to date: 3   ASK: We discussed the diagnosis of obesity with Kirsten Nelson today and Marijose agreed to give [name] permission to discuss obesity behavioral modification therapy today.  ASSESS: Norell has the diagnosis of obesity and her BMI today is 46.76 Danicka is in the action stage of change   ADVISE: Candee was educated on the multiple health risks of obesity as well as the benefit of weight loss to improve her health. She was advised of the need for long term treatment and the importance of lifestyle modifications to improve her current health and to decrease her risk of future health problems.  AGREE: Multiple dietary modification options and treatment options were discussed and  Henretta agreed to follow the recommendations documented in the above note.  ARRANGE: Stephanine was educated on the importance of frequent visits to treat obesity as outlined per CMS and USPSTF guidelines and agreed to schedule her next follow up appointment today.  I, Tammy Wysor, am acting as Location manager for [employer], FNP-C.  I have reviewed the above documentation for accuracy and completeness, and I agree with the above.  - Zhuri Krass, FNP-C.

## 2018-08-05 LAB — COMPREHENSIVE METABOLIC PANEL
ALT: 12 IU/L (ref 0–32)
AST: 14 IU/L (ref 0–40)
Albumin/Globulin Ratio: 1.3 (ref 1.2–2.2)
Albumin: 3.5 g/dL — ABNORMAL LOW (ref 3.8–4.9)
Alkaline Phosphatase: 86 IU/L (ref 39–117)
BUN/Creatinine Ratio: 13 (ref 9–23)
BUN: 12 mg/dL (ref 6–24)
Bilirubin Total: 0.4 mg/dL (ref 0.0–1.2)
CO2: 21 mmol/L (ref 20–29)
Calcium: 8.8 mg/dL (ref 8.7–10.2)
Chloride: 106 mmol/L (ref 96–106)
Creatinine, Ser: 0.9 mg/dL (ref 0.57–1.00)
GFR calc Af Amer: 81 mL/min/{1.73_m2} (ref >59)
GFR calc non Af Amer: 70 mL/min/{1.73_m2} (ref >59)
Globulin, Total: 2.7 g/dL (ref 1.5–4.5)
Glucose: 112 mg/dL — ABNORMAL HIGH (ref 65–99)
Potassium: 4.3 mmol/L (ref 3.5–5.2)
Sodium: 144 mmol/L (ref 134–144)
Total Protein: 6.2 g/dL (ref 6.0–8.5)

## 2018-08-05 LAB — VITAMIN D 25 HYDROXY (VIT D DEFICIENCY, FRACTURES): Vit D, 25-Hydroxy: 29.5 ng/mL — ABNORMAL LOW (ref 30.0–100.0)

## 2018-08-05 LAB — LIPID PANEL WITH LDL/HDL RATIO
Cholesterol, Total: 145 mg/dL (ref 100–199)
HDL: 55 mg/dL (ref >39)
LDL Calculated: 71 mg/dL (ref 0–99)
LDl/HDL Ratio: 1.3 ratio (ref 0.0–3.2)
Triglycerides: 95 mg/dL (ref 0–149)
VLDL Cholesterol Cal: 19 mg/dL (ref 5–40)

## 2018-08-05 LAB — HEMOGLOBIN A1C
Est. average glucose Bld gHb Est-mCnc: 117 mg/dL
Hgb A1c MFr Bld: 5.7 % — ABNORMAL HIGH (ref 4.8–5.6)

## 2018-08-05 LAB — INSULIN, RANDOM: INSULIN: 22.5 u[IU]/mL (ref 2.6–24.9)

## 2018-08-25 ENCOUNTER — Ambulatory Visit (INDEPENDENT_AMBULATORY_CARE_PROVIDER_SITE_OTHER): Payer: 59 | Admitting: Family Medicine

## 2018-09-06 ENCOUNTER — Ambulatory Visit (INDEPENDENT_AMBULATORY_CARE_PROVIDER_SITE_OTHER): Payer: 59 | Admitting: Bariatrics

## 2018-09-06 ENCOUNTER — Other Ambulatory Visit: Payer: Self-pay

## 2018-09-06 ENCOUNTER — Encounter (INDEPENDENT_AMBULATORY_CARE_PROVIDER_SITE_OTHER): Payer: Self-pay | Admitting: Bariatrics

## 2018-09-06 VITALS — BP 124/84 | HR 72 | Temp 98.4°F | Ht 65.0 in | Wt 278.0 lb

## 2018-09-06 DIAGNOSIS — I10 Essential (primary) hypertension: Secondary | ICD-10-CM

## 2018-09-06 DIAGNOSIS — Z6841 Body Mass Index (BMI) 40.0 and over, adult: Secondary | ICD-10-CM

## 2018-09-06 DIAGNOSIS — Z9189 Other specified personal risk factors, not elsewhere classified: Secondary | ICD-10-CM | POA: Diagnosis not present

## 2018-09-06 DIAGNOSIS — E559 Vitamin D deficiency, unspecified: Secondary | ICD-10-CM

## 2018-09-06 MED ORDER — VITAMIN D (ERGOCALCIFEROL) 1.25 MG (50000 UNIT) PO CAPS: 50000.0000 [IU] | ORAL_CAPSULE | ORAL | 0 refills | Status: DC

## 2018-09-06 NOTE — Progress Notes (Signed)
Office: [phone]  /  Fax: [phone]   HPI:   Chief Complaint: OBESITY Kirsten Nelson is here to discuss her progress with her obesity treatment plan. She is on the Category 2 plan and is following her eating plan approximately 20 % of the time. She states she is exercising 0 minutes 0 times per week. Asti is doing well overall. She did not put in quite as much effort. Her weight is 278 lb (126.1 kg) today and has had a weight loss of 3 pounds over a period of 4 weeks since her last visit. She has lost 6 lbs since starting treatment with [name].  Vitamin D deficiency Kirsten Nelson has a diagnosis of vitamin D deficiency. She is currently taking vit D and denies nausea, vomiting or muscle weakness.  At risk for osteopenia and osteoporosis Kirsten Nelson is at higher risk of osteopenia and osteoporosis due to vitamin D deficiency.   Hypertension Kirsten Nelson is a 58 y.o. female with hypertension. She is currently taking Carvedilol. Kirsten Nelson denies chest pain. She is working weight loss to help control her blood pressure with the goal of decreasing her risk of heart attack and stroke. Kirsten Nelson blood pressure is well controlled.  ASSESSMENT AND PLAN:  Vitamin D deficiency - Plan: Vitamin D, Ergocalciferol, (DRISDOL) 1.25 MG (50000 UT) CAPS capsule  Essential hypertension  At risk for osteoporosis  Class 3 severe obesity with serious comorbidity and body mass index (BMI) of 45.0 to 49.9 in adult, unspecified obesity type ([place])  PLAN:  Vitamin D Deficiency Kirsten Nelson was informed that low vitamin D levels contributes to fatigue and are associated with obesity, breast, and colon cancer. She agrees to continue to take prescription Vit D @50 ,000 IU every week #4 with no refills and will follow up for routine testing of vitamin D, at least 2-3 times per year. She was informed of the risk of over-replacement of vitamin D and agrees to not increase her dose unless she discusses this with [name] first. Cailan agrees to  follow up as directed.  At risk for osteopenia and osteoporosis Kirsten Nelson was given extended  (15 minutes) osteoporosis prevention counseling today. Kirsten Nelson is at risk for osteopenia and osteoporosis due to her vitamin D deficiency. She was encouraged to take her vitamin D and follow her higher calcium diet and increase strengthening exercise to help strengthen her bones and decrease her risk of osteopenia and osteoporosis.  Hypertension We discussed sodium restriction, working on healthy weight loss, and a regular exercise program as the means to achieve improved blood pressure control. Kirsten Nelson agreed with this plan and agreed to follow up as directed. We will continue to monitor her blood pressure as well as her progress with the above lifestyle modifications. She will continue her medications as prescribed and will watch for signs of hypotension as she continues her lifestyle modifications.  Obesity Kirsten Nelson is currently in the action stage of change. As such, her goal is to continue with weight loss efforts She has agreed to follow the Category 2 plan Kirsten Nelson has been instructed to work up to a goal of 150 minutes of combined cardio and strengthening exercise per week for weight loss and overall health benefits. We discussed the following Behavioral Modification Strategies today: increase H2O intake, no skipping meals, keeping healthy foods in the home, increasing lean protein intake, decreasing simple carbohydrates, increasing vegetables, decrease eating out and work on meal planning and easy cooking plans Additional lunch and dinner options were provided to patient today.  Kirsten Nelson has agreed to follow up with our clinic in 2 weeks. She was informed of the importance of frequent follow up visits to maximize her success with intensive lifestyle modifications for her multiple health conditions.  ALLERGIES: Allergies  Allergen Reactions   Ace Inhibitors Cough    MEDICATIONS: Current Outpatient  Medications on File Prior to Visit  Medication Sig Dispense Refill   BREO ELLIPTA 100-25 MCG/INH AEPB Take 1 puff by mouth daily.  2   carvedilol (COREG) 12.5 MG tablet Take 1 tablet (12.5 mg total) by mouth 2 (two) times daily with a meal. 180 tablet 1   levocetirizine (XYZAL) 5 MG tablet Take 5 mg by mouth daily as needed.      Montelukast Sodium (SINGULAIR PO) Take 10 mg by mouth daily as needed.      No current facility-administered medications on file prior to visit.     PAST MEDICAL HISTORY: Past Medical History:  Diagnosis Date   Allergy    Environmental allergies    HTN (hypertension)    Joint pain    Obesity     PAST SURGICAL HISTORY: Past Surgical History:  Procedure Laterality Date   TONSILLECTOMY      SOCIAL HISTORY: Social History   Tobacco Use   Smoking status: Never Smoker   Smokeless tobacco: Never Used  Substance Use Topics   Alcohol use: No   Drug use: No    FAMILY HISTORY: Family History  Problem Relation Age of Onset   Hypertension Father    Leukemia Father    Lymphoma Father    Cancer Father    Hypertension Mother    Cancer Mother    Colon cancer Neg Hx    Esophageal cancer Neg Hx    Rectal cancer Neg Hx    Stomach cancer Neg Hx     ROS: Review of Systems  Constitutional: Positive for weight loss.  Cardiovascular: Negative for chest pain.  Gastrointestinal: Negative for nausea and vomiting.  Musculoskeletal:       Negative for muscle weakness    PHYSICAL EXAM: Blood pressure 124/84, pulse 72, temperature 98.4 F (36.9 C), temperature source Oral, height 5\' 5"  (1.651 m), weight 278 lb (126.1 kg), SpO2 95 %. Body mass index is 46.26 kg/m. Physical Exam Vitals signs reviewed.  Constitutional:      Appearance: Normal appearance. She is well-developed. She is obese.  Cardiovascular:     Rate and Rhythm: Normal rate.  Pulmonary:     Effort: Pulmonary effort is normal.  Musculoskeletal: Normal range of  motion.  Skin:    General: Skin is warm and dry.  Neurological:     Mental Status: She is alert and oriented to person, place, and time.  Psychiatric:        Mood and Affect: Mood normal.        Behavior: Behavior normal.     RECENT LABS AND TESTS: BMET    Component Value Date/Time   NA 144 08/04/2018 0813   K 4.3 08/04/2018 0813   CL 106 08/04/2018 0813   CO2 21 08/04/2018 0813   GLUCOSE 112 (H) 08/04/2018 0813   GLUCOSE 92 03/25/2018 1035   BUN 12 08/04/2018 0813   CREATININE 0.90 08/04/2018 0813   CALCIUM 8.8 08/04/2018 0813   GFRNONAA 70 08/04/2018 0813   GFRAA 81 08/04/2018 0813   Lab Results  Component Value Date   HGBA1C 5.7 (H) 08/04/2018   HGBA1C 6.0 (H) 04/19/2018   Lab Results  Component Value  Date   INSULIN 22.5 08/04/2018   INSULIN 20.9 04/19/2018   CBC    Component Value Date/Time   WBC 9.1 04/19/2018 0929   WBC 8.3 05/19/2016 0942   RBC 5.17 04/19/2018 0929   RBC 5.18 (H) 05/19/2016 0942   HGB 14.8 04/19/2018 0929   HCT 44.3 04/19/2018 0929   PLT 239.0 05/19/2016 0942   MCV 86 04/19/2018 0929   MCH 28.6 04/19/2018 0929   MCHC 33.4 04/19/2018 0929   MCHC 32.8 05/19/2016 0942   RDW 12.9 04/19/2018 0929   LYMPHSABS 2.1 04/19/2018 0929   MONOABS 0.5 05/19/2016 0942   EOSABS 0.2 04/19/2018 0929   BASOSABS 0.0 04/19/2018 0929   Iron/TIBC/Ferritin/ %Sat No results found for: IRON, TIBC, FERRITIN, IRONPCTSAT Lipid Panel     Component Value Date/Time   CHOL 145 08/04/2018 0813   TRIG 95 08/04/2018 0813   HDL 55 08/04/2018 0813   CHOLHDL 2 03/25/2018 1035   VLDL 16.4 03/25/2018 1035   LDLCALC 71 08/04/2018 0813   Hepatic Function Panel     Component Value Date/Time   PROT 6.2 08/04/2018 0813   ALBUMIN 3.5 (L) 08/04/2018 0813   AST 14 08/04/2018 0813   ALT 12 08/04/2018 0813   ALKPHOS 86 08/04/2018 0813   BILITOT 0.4 08/04/2018 0813   BILIDIR 0.1 03/30/2012 0921      Component Value Date/Time   TSH 0.870 04/19/2018 0929   TSH 1.42  03/16/2016 1643   TSH 1.37 03/30/2012 0921   Results for SAMANTHAMARIE, EZZELL (MRN [id number]) as of 09/06/2018 16:30  Ref. Range 08/04/2018 08:13  Vitamin D, 25-Hydroxy Latest Ref Range: 30.0 - 100.0 ng/mL 29.5 (L)     OBESITY BEHAVIORAL INTERVENTION VISIT  Today's visit was # 8   Starting weight: 284 lbs Starting date: 04/19/2018 Today's weight : 278 lbs Today's date: 09/06/2018 Total lbs lost to date: 6    09/06/2018  Height 5\' 5"  (1.651 m)  Weight 278 lb (126.1 kg)  BMI (Calculated) 46.26  BLOOD PRESSURE - SYSTOLIC 004  BLOOD PRESSURE - DIASTOLIC 84   Body Fat % 56 %    ASK: We discussed the diagnosis of obesity with Kirsten Nelson today and Tulani agreed to give [name] permission to discuss obesity behavioral modification therapy today.  ASSESS: Onisha has the diagnosis of obesity and her BMI today is 46.26 Kirsten Nelson is in the action stage of change   ADVISE: Darica was educated on the multiple health risks of obesity as well as the benefit of weight loss to improve her health. She was advised of the need for long term treatment and the importance of lifestyle modifications to improve her current health and to decrease her risk of future health problems.  AGREE: Multiple dietary modification options and treatment options were discussed and  Minda agreed to follow the recommendations documented in the above note.  ARRANGE: Annaelle was educated on the importance of frequent visits to treat obesity as outlined per CMS and USPSTF guidelines and agreed to schedule her next follow up appointment today.  Corey Skains, am acting as Location manager for [employer]. Owens Shark, DO  I have reviewed the above documentation for accuracy and completeness, and I agree with the above. -Jearld Lesch, DO

## 2018-09-08 ENCOUNTER — Encounter (INDEPENDENT_AMBULATORY_CARE_PROVIDER_SITE_OTHER): Payer: Self-pay

## 2018-09-13 ENCOUNTER — Encounter (INDEPENDENT_AMBULATORY_CARE_PROVIDER_SITE_OTHER): Payer: Self-pay

## 2018-09-21 ENCOUNTER — Ambulatory Visit (INDEPENDENT_AMBULATORY_CARE_PROVIDER_SITE_OTHER): Payer: 59 | Admitting: Family Medicine

## 2018-09-24 ENCOUNTER — Other Ambulatory Visit (INDEPENDENT_AMBULATORY_CARE_PROVIDER_SITE_OTHER): Payer: Self-pay | Admitting: Bariatrics

## 2018-09-24 DIAGNOSIS — E559 Vitamin D deficiency, unspecified: Secondary | ICD-10-CM

## 2018-10-27 ENCOUNTER — Encounter: Payer: Self-pay | Admitting: Gastroenterology

## 2018-10-27 DIAGNOSIS — M170 Bilateral primary osteoarthritis of knee: Secondary | ICD-10-CM | POA: Diagnosis not present

## 2019-03-30 ENCOUNTER — Other Ambulatory Visit: Payer: Self-pay

## 2019-03-30 ENCOUNTER — Encounter: Payer: Self-pay | Admitting: Family Medicine

## 2019-03-30 ENCOUNTER — Ambulatory Visit: Payer: 59 | Admitting: Family Medicine

## 2019-03-30 VITALS — BP 118/98 | HR 71 | Temp 97.4°F | Resp 18 | Ht 65.0 in | Wt 291.0 lb

## 2019-03-30 DIAGNOSIS — E559 Vitamin D deficiency, unspecified: Secondary | ICD-10-CM | POA: Diagnosis not present

## 2019-03-30 DIAGNOSIS — I10 Essential (primary) hypertension: Secondary | ICD-10-CM | POA: Diagnosis not present

## 2019-03-30 DIAGNOSIS — Z23 Encounter for immunization: Secondary | ICD-10-CM | POA: Diagnosis not present

## 2019-03-30 LAB — LIPID PANEL
Cholesterol: 146 mg/dL (ref 0–200)
HDL: 50.3 mg/dL (ref >39.00)
LDL Cholesterol: 70 mg/dL (ref 0–99)
NonHDL: 96.12
Total CHOL/HDL Ratio: 3
Triglycerides: 133 mg/dL (ref 0.0–149.0)
VLDL: 26.6 mg/dL (ref 0.0–40.0)

## 2019-03-30 LAB — COMPREHENSIVE METABOLIC PANEL
ALT: 10 U/L (ref 0–35)
AST: 12 U/L (ref 0–37)
Albumin: 3.7 g/dL (ref 3.5–5.2)
Alkaline Phosphatase: 81 U/L (ref 39–117)
BUN: 19 mg/dL (ref 6–23)
CO2: 27 mEq/L (ref 19–32)
Calcium: 9.3 mg/dL (ref 8.4–10.5)
Chloride: 104 mEq/L (ref 96–112)
Creatinine, Ser: 0.93 mg/dL (ref 0.40–1.20)
GFR: 61.57 mL/min (ref >60.00)
Glucose, Bld: 112 mg/dL — ABNORMAL HIGH (ref 70–99)
Potassium: 4.4 mEq/L (ref 3.5–5.1)
Sodium: 139 mEq/L (ref 135–145)
Total Bilirubin: 0.5 mg/dL (ref 0.2–1.2)
Total Protein: 6.6 g/dL (ref 6.0–8.3)

## 2019-03-30 MED ORDER — CARVEDILOL 12.5 MG PO TABS: 12.5000 mg | ORAL_TABLET | Freq: Two times a day (BID) | ORAL | 1 refills | Status: DC

## 2019-03-30 MED ORDER — VITAMIN D (ERGOCALCIFEROL) 1.25 MG (50000 UNIT) PO CAPS: 50000.0000 [IU] | ORAL_CAPSULE | ORAL | 3 refills | Status: DC

## 2019-03-30 NOTE — Assessment & Plan Note (Addendum)
Slightly high -- repeat bp a little lower .pt given dash diet and will f/u 3 months

## 2019-03-30 NOTE — Patient Instructions (Signed)

## 2019-03-30 NOTE — Progress Notes (Signed)
Patient ID: Kirsten Nelson, female    DOB: 01/09/1961  Age: 58 y.o. MRN: [id number]    Subjective:  Subjective  HPI Kirsten Nelson presents for f/u bp and refills Pt with no complaints   Review of Systems  Constitutional: Negative for chills and fever.  HENT: Negative for congestion and hearing loss.   Eyes: Negative for discharge.  Respiratory: Negative for cough and shortness of breath.   Cardiovascular: Negative for chest pain, palpitations and leg swelling.  Gastrointestinal: Negative for abdominal pain, blood in stool, constipation, diarrhea, nausea and vomiting.  Genitourinary: Negative for dysuria, frequency, hematuria and urgency.  Musculoskeletal: Negative for back pain and myalgias.  Skin: Negative for rash.  Allergic/Immunologic: Negative for environmental allergies.  Neurological: Negative for dizziness, weakness and headaches.  Hematological: Does not bruise/bleed easily.  Psychiatric/Behavioral: Negative for suicidal ideas. The patient is not nervous/anxious.     History Past Medical History:  Diagnosis Date   Allergy    Environmental allergies    HTN (hypertension)    Joint pain    Obesity     She has a past surgical history that includes Tonsillectomy.   Her family history includes Cancer in her father and mother; Hypertension in her father and mother; Leukemia in her father; Lymphoma in her father.She reports that she has never smoked. She has never used smokeless tobacco. She reports that she does not drink alcohol or use drugs.  Current Outpatient Medications on File Prior to Visit  Medication Sig Dispense Refill   BREO ELLIPTA 100-25 MCG/INH AEPB Take 1 puff by mouth daily.  2   levocetirizine (XYZAL) 5 MG tablet Take 5 mg by mouth daily as needed.      Montelukast Sodium (SINGULAIR PO) Take 10 mg by mouth daily as needed.      No current facility-administered medications on file prior to visit.      Objective:  Objective  Physical  Exam Vitals signs and nursing note reviewed.  Constitutional:      Appearance: She is well-developed.  HENT:     Head: Normocephalic and atraumatic.  Eyes:     Conjunctiva/sclera: Conjunctivae normal.  Neck:     Musculoskeletal: Normal range of motion and neck supple.     Thyroid: No thyromegaly.     Vascular: No carotid bruit or JVD.  Cardiovascular:     Rate and Rhythm: Normal rate and regular rhythm.     Heart sounds: Normal heart sounds. No murmur.  Pulmonary:     Effort: Pulmonary effort is normal. No respiratory distress.     Breath sounds: Normal breath sounds. No wheezing or rales.  Chest:     Chest wall: No tenderness.  Neurological:     Mental Status: She is alert and oriented to person, place, and time.    BP (!) 118/98    Pulse 71    Temp (!) 97.4 F (36.3 C) (Temporal)    Resp 18    Ht 5\' 5"  (1.651 m)    Wt 291 lb (132 kg)    SpO2 95%    BMI 48.42 kg/m  Wt Readings from Last 3 Encounters:  03/30/19 291 lb (132 kg)  09/06/18 278 lb (126.1 kg)  08/04/18 281 lb (127.5 kg)     Lab Results  Component Value Date   WBC 9.1 04/19/2018   HGB 14.8 04/19/2018   HCT 44.3 04/19/2018   PLT 239.0 05/19/2016   GLUCOSE 112 (H) 08/04/2018   CHOL 145 08/04/2018  TRIG 95 08/04/2018   HDL 55 08/04/2018   LDLCALC 71 08/04/2018   ALT 12 08/04/2018   AST 14 08/04/2018   NA 144 08/04/2018   K 4.3 08/04/2018   CL 106 08/04/2018   CREATININE 0.90 08/04/2018   BUN 12 08/04/2018   CO2 21 08/04/2018   TSH 0.870 04/19/2018   HGBA1C 5.7 (H) 08/04/2018    Dg Chest 2 View  Result Date: 12/14/2016 CLINICAL DATA:  Chronic cough, worse in past 3 weeks.  Pneumonia? EXAM: CHEST  2 VIEW COMPARISON:  None. FINDINGS: Heart size is upper normal. Overall cardiomediastinal silhouette is within normal limits. Lungs are clear.  No pleural effusion or pneumothorax. Slight elevation of the right hemidiaphragm. Osseous structures about the chest are unremarkable. IMPRESSION: No active  cardiopulmonary disease. No evidence of pneumonia or pulmonary edema. Electronically Signed   By: Franki Cabot M.D.   On: 12/14/2016 10:53     Assessment & Plan:  Plan  I am having Kirsten Nelson maintain her levocetirizine, Montelukast Sodium (SINGULAIR PO), Breo Ellipta, carvedilol, and Vitamin D (Ergocalciferol).  Meds ordered this encounter  Medications   carvedilol (COREG) 12.5 MG tablet    Sig: Take 1 tablet (12.5 mg total) by mouth 2 (two) times daily with a meal.    Dispense:  180 tablet    Refill:  1   Vitamin D, Ergocalciferol, (DRISDOL) 1.25 MG (50000 UT) CAPS capsule    Sig: Take 1 capsule (50,000 Units total) by mouth every 7 (seven) days.    Dispense:  4 capsule    Refill:  3    Problem List Items Addressed This Visit      Unprioritized   Essential hypertension    Slightly high -- repeat bp a little lower .pt given dash diet and will f/u 3 months       Relevant Medications   carvedilol (COREG) 12.5 MG tablet   Other Relevant Orders   Lipid panel   Comprehensive metabolic panel   Vitamin D deficiency    Check labs today      Relevant Medications   Vitamin D, Ergocalciferol, (DRISDOL) 1.25 MG (50000 UT) CAPS capsule   Other Relevant Orders   VITAMIN D 25 Hydroxy (Vit-D Deficiency, Fractures)    Other Visit Diagnoses    Need for influenza vaccination    -  Primary   Relevant Orders   Flu Vaccine QUAD 6+ mos PF IM (Fluarix Quad PF) (Completed)      Follow-up: Return in about 3 months (around 06/30/2019), or if symptoms worsen or fail to improve, for hypertension.  Ann Held, DO

## 2019-03-30 NOTE — Assessment & Plan Note (Signed)
Check labs today.

## 2019-05-24 ENCOUNTER — Encounter: Payer: Self-pay | Admitting: Family Medicine

## 2019-05-24 ENCOUNTER — Other Ambulatory Visit: Payer: Self-pay

## 2019-05-24 ENCOUNTER — Ambulatory Visit (INDEPENDENT_AMBULATORY_CARE_PROVIDER_SITE_OTHER): Payer: 59 | Admitting: Family Medicine

## 2019-05-24 DIAGNOSIS — R42 Dizziness and giddiness: Secondary | ICD-10-CM

## 2019-05-24 MED ORDER — FLUTICASONE PROPIONATE 50 MCG/ACT NA SUSP: 2.0000 | Freq: Every day | NASAL | 6 refills | Status: DC

## 2019-05-24 MED ORDER — MECLIZINE HCL 25 MG PO TABS: 25.0000 mg | ORAL_TABLET | Freq: Three times a day (TID) | ORAL | 0 refills | Status: AC | PRN

## 2019-05-24 NOTE — Progress Notes (Signed)
Virtual Visit via Video Note  I connected with Kirsten Nelson on 05/24/19 at  9:20 AM EST by a video enabled telemedicine application and verified that I am speaking with the correct person using two identifiers.  Location: Patient: home  Provider: home    I discussed the limitations of evaluation and management by telemedicine and the availability of in person appointments. The patient expressed understanding and agreed to proceed.  History of Present Illness:  Pt is home c/o dizziness --- she has a hx of vertigo but its been years since she has a problems  Observations/Objective: No fevers No other vitals obtained  Assessment and Plan: 1. Vertigo ? Allergies vs BPV Add flonase to xyzal and start taking singulair again Meclizine sent in App on Friday ---pt may cancel if symptoms resolve - meclizine (ANTIVERT) 25 MG tablet; Take 1 tablet (25 mg total) by mouth 3 (three) times daily as needed for dizziness.  Dispense: 30 tablet; Refill: 0 - fluticasone (FLONASE) 50 MCG/ACT nasal spray; Place 2 sprays into both nostrils daily.  Dispense: 16 g; Refill: 6   Follow Up Instructions:    I discussed the assessment and treatment plan with the patient. The patient was provided an opportunity to ask questions and all were answered. The patient agreed with the plan and demonstrated an understanding of the instructions.   The patient was advised to call back or seek an in-person evaluation if the symptoms worsen or if the condition fails to improve as anticipated.  I provided 15 minutes of non-face-to-face time during this encounter.   Ann Held, DO

## 2019-05-26 ENCOUNTER — Other Ambulatory Visit: Payer: Self-pay

## 2019-05-26 ENCOUNTER — Encounter: Payer: Self-pay | Admitting: Family Medicine

## 2019-05-26 ENCOUNTER — Ambulatory Visit: Payer: 59 | Admitting: Family Medicine

## 2019-05-26 VITALS — BP 124/90 | HR 74 | Temp 97.0°F | Resp 18 | Ht 65.0 in | Wt 287.0 lb

## 2019-05-26 DIAGNOSIS — H9312 Tinnitus, left ear: Secondary | ICD-10-CM | POA: Diagnosis not present

## 2019-05-26 DIAGNOSIS — R42 Dizziness and giddiness: Secondary | ICD-10-CM | POA: Diagnosis not present

## 2019-05-26 LAB — CBC WITH DIFFERENTIAL/PLATELET
Basophils Absolute: 0 10*3/uL (ref 0.0–0.1)
Basophils Relative: 0.5 % (ref 0.0–3.0)
Eosinophils Absolute: 0.2 10*3/uL (ref 0.0–0.7)
Eosinophils Relative: 2.9 % (ref 0.0–5.0)
HCT: 45.7 % (ref 36.0–46.0)
Hemoglobin: 15 g/dL (ref 12.0–15.0)
Lymphocytes Relative: 24.3 % (ref 12.0–46.0)
Lymphs Abs: 2 10*3/uL (ref 0.7–4.0)
MCHC: 32.7 g/dL (ref 30.0–36.0)
MCV: 85.5 fl (ref 78.0–100.0)
Monocytes Absolute: 0.7 10*3/uL (ref 0.1–1.0)
Monocytes Relative: 8.2 % (ref 3.0–12.0)
Neutro Abs: 5.2 10*3/uL (ref 1.4–7.7)
Neutrophils Relative %: 64.1 % (ref 43.0–77.0)
Platelets: 275 10*3/uL (ref 150.0–400.0)
RBC: 5.35 Mil/uL — ABNORMAL HIGH (ref 3.87–5.11)
RDW: 13.8 % (ref 11.5–15.5)
WBC: 8.2 10*3/uL (ref 4.0–10.5)

## 2019-05-26 LAB — COMPREHENSIVE METABOLIC PANEL
ALT: 13 U/L (ref 0–35)
AST: 14 U/L (ref 0–37)
Albumin: 3.9 g/dL (ref 3.5–5.2)
Alkaline Phosphatase: 88 U/L (ref 39–117)
BUN: 14 mg/dL (ref 6–23)
CO2: 28 mEq/L (ref 19–32)
Calcium: 9.4 mg/dL (ref 8.4–10.5)
Chloride: 104 mEq/L (ref 96–112)
Creatinine, Ser: 0.93 mg/dL (ref 0.40–1.20)
GFR: 61.54 mL/min (ref >60.00)
Glucose, Bld: 104 mg/dL — ABNORMAL HIGH (ref 70–99)
Potassium: 4.3 mEq/L (ref 3.5–5.1)
Sodium: 141 mEq/L (ref 135–145)
Total Bilirubin: 0.6 mg/dL (ref 0.2–1.2)
Total Protein: 6.7 g/dL (ref 6.0–8.3)

## 2019-05-26 LAB — VITAMIN B12: Vitamin B-12: 392 pg/mL (ref 211–911)

## 2019-05-26 LAB — TSH: TSH: 1.19 u[IU]/mL (ref 0.35–4.50)

## 2019-05-26 NOTE — Patient Instructions (Signed)

## 2019-05-26 NOTE — Progress Notes (Signed)
Patient ID: Kirsten Nelson, female    DOB: 04-01-1961  Age: 58 y.o. MRN: [id number]    Subjective:  Subjective  HPI  Kirsten Nelson presents for f/u dizziness.  Still occurs at night only   no congestion, no fevers  No ear pain No palpitations, no chest pain  Review of Systems  Constitutional: Negative for activity change, appetite change, chills and fever.  HENT: Negative for congestion, ear discharge, ear pain, facial swelling, hearing loss, mouth sores, nosebleeds, rhinorrhea, sinus pressure, sinus pain, sneezing, sore throat and tinnitus.   Respiratory: Negative for cough and shortness of breath.   Cardiovascular: Negative for chest pain and palpitations.  Gastrointestinal: Negative for abdominal distention and abdominal pain.  Genitourinary: Negative for vaginal discharge and vaginal pain.  Musculoskeletal: Negative for back pain.  Psychiatric/Behavioral: Negative for behavioral problems and dysphoric mood. The patient is not nervous/anxious.     History Past Medical History:  Diagnosis Date  . Allergy   . Environmental allergies   . HTN (hypertension)   . Joint pain   . Obesity     She has a past surgical history that includes Tonsillectomy.   Her family history includes Cancer in her father and mother; Hypertension in her father and mother; Leukemia in her father; Lymphoma in her father.She reports that she has never smoked. She has never used smokeless tobacco. She reports that she does not drink alcohol or use drugs.  Current Outpatient Medications on File Prior to Visit  Medication Sig Dispense Refill  . BREO ELLIPTA 100-25 MCG/INH AEPB Take 1 puff by mouth daily.  2  . carvedilol (COREG) 12.5 MG tablet Take 1 tablet (12.5 mg total) by mouth 2 (two) times daily with a meal. 180 tablet 1  . fluticasone (FLONASE) 50 MCG/ACT nasal spray Place 2 sprays into both nostrils daily. 16 g 6  . levocetirizine (XYZAL) 5 MG tablet Take 5 mg by mouth daily as needed.     .  meclizine (ANTIVERT) 25 MG tablet Take 1 tablet (25 mg total) by mouth 3 (three) times daily as needed for dizziness. 30 tablet 0  . Montelukast Sodium (SINGULAIR PO) Take 10 mg by mouth daily as needed.     . Vitamin D, Ergocalciferol, (DRISDOL) 1.25 MG (50000 UT) CAPS capsule Take 1 capsule (50,000 Units total) by mouth every 7 (seven) days. 4 capsule 3   No current facility-administered medications on file prior to visit.      Objective:  Objective  Physical Exam Vitals signs and nursing note reviewed.  Constitutional:      Appearance: She is well-developed.  HENT:     Head: Normocephalic and atraumatic.  Eyes:     Conjunctiva/sclera: Conjunctivae normal.  Neck:     Musculoskeletal: Normal range of motion and neck supple.     Thyroid: No thyromegaly.     Vascular: No carotid bruit or JVD.  Cardiovascular:     Rate and Rhythm: Normal rate and regular rhythm.     Heart sounds: Normal heart sounds. No murmur.  Pulmonary:     Effort: Pulmonary effort is normal. No respiratory distress.     Breath sounds: Normal breath sounds. No wheezing or rales.  Chest:     Chest wall: No tenderness.  Neurological:     Mental Status: She is alert and oriented to person, place, and time.    BP 124/90 (BP Location: Right Arm, Patient Position: Sitting, Cuff Size: Large)   Pulse 74   Temp Marland Kitchen)  97 F (36.1 C) (Temporal)   Resp 18   Ht 5\' 5"  (1.651 m)   Wt 287 lb (130.2 kg)   SpO2 94%   BMI 47.76 kg/m  Wt Readings from Last 3 Encounters:  05/26/19 287 lb (130.2 kg)  03/30/19 291 lb (132 kg)  09/06/18 278 lb (126.1 kg)     Lab Results  Component Value Date   WBC 9.1 04/19/2018   HGB 14.8 04/19/2018   HCT 44.3 04/19/2018   PLT 239.0 05/19/2016   GLUCOSE 112 (H) 03/30/2019   CHOL 146 03/30/2019   TRIG 133.0 03/30/2019   HDL 50.30 03/30/2019   LDLCALC 70 03/30/2019   ALT 10 03/30/2019   AST 12 03/30/2019   NA 139 03/30/2019   K 4.4 03/30/2019   CL 104 03/30/2019   CREATININE  0.93 03/30/2019   BUN 19 03/30/2019   CO2 27 03/30/2019   TSH 0.870 04/19/2018   HGBA1C 5.7 (H) 08/04/2018    Dg Chest 2 View  Result Date: 12/14/2016 CLINICAL DATA:  Chronic cough, worse in past 3 weeks.  Pneumonia? EXAM: CHEST  2 VIEW COMPARISON:  None. FINDINGS: Heart size is upper normal. Overall cardiomediastinal silhouette is within normal limits. Lungs are clear.  No pleural effusion or pneumothorax. Slight elevation of the right hemidiaphragm. Osseous structures about the chest are unremarkable. IMPRESSION: No active cardiopulmonary disease. No evidence of pneumonia or pulmonary edema. Electronically Signed   By: Franki Cabot M.D.   On: 12/14/2016 10:53     Assessment & Plan:  Plan  I am having Lata L. Pattillo maintain her levocetirizine, Montelukast Sodium (SINGULAIR PO), Breo Ellipta, carvedilol, Vitamin D (Ergocalciferol), meclizine, and fluticasone.  No orders of the defined types were placed in this encounter.   Problem List Items Addressed This Visit    None    Visit Diagnoses    Tinnitus of left ear    -  Primary   Relevant Orders   Ambulatory referral to ENT   TSH   Vitamin B12   CBC with Differential   Comprehensive metabolic panel   Dizziness       Relevant Orders   Ambulatory referral to ENT   TSH   Vitamin B12   CBC with Differential   Comprehensive metabolic panel      H.O. on epley manuver given to the patient   Follow-up: Return if symptoms worsen or fail to improve.  Ann Held, DO

## 2019-07-17 ENCOUNTER — Ambulatory Visit (HOSPITAL_BASED_OUTPATIENT_CLINIC_OR_DEPARTMENT_OTHER)
Admission: RE | Admit: 2019-07-17 | Discharge: 2019-07-17 | Disposition: A | Discharge status: Home / Self Care | Payer: 59 | Source: Ambulatory Visit | Attending: Internal Medicine | Admitting: Internal Medicine

## 2019-07-17 ENCOUNTER — Ambulatory Visit: Payer: 59 | Admitting: Internal Medicine

## 2019-07-17 ENCOUNTER — Other Ambulatory Visit: Payer: Self-pay

## 2019-07-17 ENCOUNTER — Encounter: Payer: Self-pay | Admitting: Internal Medicine

## 2019-07-17 VITALS — BP 126/91 | HR 67 | Temp 96.8°F | Resp 18 | Ht 65.0 in | Wt 294.0 lb

## 2019-07-17 DIAGNOSIS — M79642 Pain in left hand: Secondary | ICD-10-CM | POA: Insufficient documentation

## 2019-07-17 MED ORDER — MELOXICAM 15 MG PO TABS: 15.0000 mg | ORAL_TABLET | Freq: Every day | ORAL | 0 refills | Status: AC | PRN

## 2019-07-17 NOTE — Progress Notes (Signed)
Pre visit review using our clinic review tool, if applicable. No additional management support is needed unless otherwise documented below in the visit note. 

## 2019-07-17 NOTE — Progress Notes (Signed)
   Subjective:    Patient ID: Kirsten Nelson, female    DOB: Nov 30, 1960, 59 y.o.   MRN: [id number]  DOS:  07/17/2019 Type of visit - description: Acute Having pain at the left hand and wrist for 3 weeks. The pain decreases with rest and increased with the use of her hand. No swelling, redness, warmness, injury. 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    Review of Systems See above   Past Medical History:  Diagnosis Date  . Allergy   . Environmental allergies   . HTN (hypertension)   . Joint pain   . Obesity     Past Surgical History:  Procedure Laterality Date  . TONSILLECTOMY          Objective:   Physical Exam BP (!) 126/91 (BP Location: Left Arm, Patient Position: Sitting, Cuff Size: Normal)   Pulse 67   Temp (!) 96.8 F (36 C) (Temporal)   Resp 18   Ht 5\' 5"  (1.651 m)   Wt 294 lb (133.4 kg)   SpO2 96%   BMI 48.92 kg/m  General:   Well developed, NAD, BMI noted. HEENT:  Normocephalic . Face symmetric, atraumatic MSK: Right upper extremity normal Left upper extremity: Normal except for minimal puffiness at the wrist without redness, warmness.  Wrist is not TTP. Pain elicited with active wrist range of motion but not with passive range of motion. Skin: Not pale. Not jaundice Neurologic:  alert & oriented X3.  Speech normal, gait appropriate for age and unassisted Psych--  Cognition and judgment appear intact.  Cooperative with normal attention span and concentration.  Behavior appropriate. No anxious or depressed appearing.      Assessment       59 year old female, PMH includes HTN, obesity, presents with Left wrist pain: No history of injury, tendinitis?  Overuse syndrome?. Plan: X-ray for completeness, meloxicam for 4 to 5 days then as needed, strict GI precautions discussed, CTS brace for 1 week, call if not improving. Ice as needed is okay  This visit occurred during the SARS-CoV-2 public health emergency.   Safety protocols were in place, including screening questions prior to the visit, additional usage of staff PPE, and extensive cleaning of exam room while observing appropriate contact time as indicated for disinfecting solutions.

## 2019-07-17 NOTE — Patient Instructions (Signed)
    STOP BY THE FIRST FLOOR:  get the XR   Take Meloxicam 15 mg once a day  as needed for pain.  Always take it with food because may cause gastritis and ulcers.  If you notice nausea, stomach pain, change in the color of stools --->  Stop the medicine and let us know  Get a CARPAL TUNNEL brace x 1 week, day time and night time if needed  Call if no better

## 2019-07-18 ENCOUNTER — Other Ambulatory Visit: Payer: Self-pay | Admitting: Family Medicine

## 2019-07-18 DIAGNOSIS — E559 Vitamin D deficiency, unspecified: Secondary | ICD-10-CM

## 2019-08-03 ENCOUNTER — Telehealth: Payer: Self-pay | Admitting: Family Medicine

## 2019-08-03 NOTE — Telephone Encounter (Signed)
Caller Name: Kirsten Nelson, self Phone: [phone]  Pt states she called in earlier this morning because she received an email about having a new bill from [employer] for almost $300. She said that it has to be related to her last visit. Pt state she spoke to [place] and he told her she was being transferred and she was disconnected. On calling back she waited for 10 minutes and that we had poor customer service. I apologized for delay due to short staffing and for being disconnected. I told her likely she was being transferred to the billing department as the office does not submit claims and the office staff does not have access to the billing details. She said that this was not explained before and she had issues before regarding scheduling with the same person. Her appointment was scheduled incorrectly. She felt this needed to be addressed. I told her the office managers are able to see the billing information and address concerns and that I would have the mgr call her back.

## 2019-08-21 ENCOUNTER — Telehealth: Payer: Self-pay | Admitting: Family Medicine

## 2019-08-21 NOTE — Telephone Encounter (Signed)
Patient states that she is having side effects from blood pressure medication, would like to request the doctor to change it.   Patient is currently on : carvedilol (COREG) 12.5 MG tablet

## 2019-08-21 NOTE — Telephone Encounter (Signed)
Pt states she is having dry cough, wheezing, and occ SOB, and rapid weight gain. Pt states she Googled and these were the side effects she saw that was associated with BP medication.

## 2019-08-22 NOTE — Telephone Encounter (Signed)
Not common side effects--- can make ov to discuss

## 2019-08-23 NOTE — Telephone Encounter (Signed)
Tele visit set for Friday

## 2019-08-24 NOTE — Telephone Encounter (Signed)
LMOVM for pt to call me back to discuss billing issue and customer service issue.

## 2019-08-25 ENCOUNTER — Ambulatory Visit (INDEPENDENT_AMBULATORY_CARE_PROVIDER_SITE_OTHER): Payer: 59 | Admitting: Family Medicine

## 2019-08-25 ENCOUNTER — Encounter: Payer: Self-pay | Admitting: Family Medicine

## 2019-08-25 ENCOUNTER — Other Ambulatory Visit: Payer: Self-pay

## 2019-08-25 VITALS — Ht 65.0 in

## 2019-08-25 DIAGNOSIS — I10 Essential (primary) hypertension: Secondary | ICD-10-CM | POA: Diagnosis not present

## 2019-08-25 MED ORDER — LOSARTAN POTASSIUM 50 MG PO TABS: 50.0000 mg | ORAL_TABLET | Freq: Every day | ORAL | 2 refills | Status: DC

## 2019-08-25 NOTE — Progress Notes (Signed)
.  Virtual Visit via telephone Note  I connected with Kirsten Nelson on 08/25/19 at  2:30 PM EST by a  telemedicine application and verified that I am speaking with the correct person using two identifiers.  Location: Patient: home  Provider: office    I discussed the limitations of evaluation and management by telemedicine and the availability of in person appointments. The patient expressed understanding and agreed to proceed.  History of Present Illness: Pt is home c/o cough / wheezing with coreg.  She stopped it a few days ago and the wheezing improved but not the cough   Observations/Objective: No vitals obtained  Pt is in nad   Assessment and Plan: Essential hypertension - Plan: losartan (COZAAR) 50 MG tablet d/c coreg startlosartan and rto in 2-3 weeks    Follow Up Instructions:    I discussed the assessment and treatment plan with the patient. The patient was provided an opportunity to ask questions and all were answered. The patient agreed with the plan and demonstrated an understanding of the instructions.   The patient was advised to call back or seek an in-person evaluation if the symptoms worsen or if the condition fails to improve as anticipated.  I provided 25 minutes of non-face-to-face time during this encounter.   Ann Held, DO

## 2019-08-27 ENCOUNTER — Ambulatory Visit: Payer: 59 | Attending: Internal Medicine

## 2019-08-27 DIAGNOSIS — Z23 Encounter for immunization: Secondary | ICD-10-CM | POA: Insufficient documentation

## 2019-08-27 NOTE — Progress Notes (Signed)
   Covid-19 Vaccination Clinic  Name:  Kirsten Nelson    MRN: [id number] DOB: 02-Jan-1961  08/27/2019  [name] was observed post Covid-19 immunization for 15 minutes without incident. She was provided with Vaccine Information Sheet and instruction to access the V-Safe system.   [name] was instructed to call 911 with any severe reactions post vaccine: Marland Kitchen Difficulty breathing  . Swelling of face and throat  . A fast heartbeat  . A bad rash all over body  . Dizziness and weakness   Immunizations Administered    Name Date Dose VIS Date Route   Pfizer COVID-19 Vaccine 08/27/2019  6:00 PM 0.3 mL 06/02/2019 Intramuscular   Manufacturer: [place]   Lot: [id number]   [place]: [id number]

## 2019-09-20 ENCOUNTER — Other Ambulatory Visit: Payer: Self-pay

## 2019-09-20 ENCOUNTER — Ambulatory Visit: Payer: 59 | Attending: Internal Medicine

## 2019-09-20 DIAGNOSIS — Z23 Encounter for immunization: Secondary | ICD-10-CM

## 2019-09-20 NOTE — Progress Notes (Signed)
   Covid-19 Vaccination Clinic  Name:  Kirsten Nelson    MRN: [id number] DOB: [birth year]  09/20/2019  [name] was observed post Covid-19 immunization for 15 minutes without incident. She was provided with Vaccine Information Sheet and instruction to access the V-Safe system.   [name] was instructed to call 911 with any severe reactions post vaccine: Marland Kitchen Difficulty breathing  . Swelling of face and throat  . A fast heartbeat  . A bad rash all over body  . Dizziness and weakness   Immunizations Administered    Name Date Dose VIS Date Route   Pfizer COVID-19 Vaccine 09/20/2019  8:16 AM 0.3 mL 06/02/2019 Intramuscular   Manufacturer: [place]   Lot: [id number]   [name]: [id number]

## 2019-09-21 ENCOUNTER — Ambulatory Visit: Payer: 59 | Admitting: Family Medicine

## 2019-09-21 ENCOUNTER — Encounter: Payer: Self-pay | Admitting: Family Medicine

## 2019-09-21 ENCOUNTER — Other Ambulatory Visit: Payer: Self-pay

## 2019-09-21 DIAGNOSIS — Z6841 Body Mass Index (BMI) 40.0 and over, adult: Secondary | ICD-10-CM | POA: Diagnosis not present

## 2019-09-21 DIAGNOSIS — Z9109 Other allergy status, other than to drugs and biological substances: Secondary | ICD-10-CM | POA: Diagnosis not present

## 2019-09-21 DIAGNOSIS — I10 Essential (primary) hypertension: Secondary | ICD-10-CM

## 2019-09-21 LAB — COMPREHENSIVE METABOLIC PANEL
ALT: 13 U/L (ref 0–35)
AST: 16 U/L (ref 0–37)
Albumin: 3.8 g/dL (ref 3.5–5.2)
Alkaline Phosphatase: 93 U/L (ref 39–117)
BUN: 13 mg/dL (ref 6–23)
CO2: 28 mEq/L (ref 19–32)
Calcium: 8.9 mg/dL (ref 8.4–10.5)
Chloride: 104 mEq/L (ref 96–112)
Creatinine, Ser: 0.8 mg/dL (ref 0.40–1.20)
GFR: 73.13 mL/min (ref >60.00)
Glucose, Bld: 102 mg/dL — ABNORMAL HIGH (ref 70–99)
Potassium: 4.1 mEq/L (ref 3.5–5.1)
Sodium: 139 mEq/L (ref 135–145)
Total Bilirubin: 0.4 mg/dL (ref 0.2–1.2)
Total Protein: 6.8 g/dL (ref 6.0–8.3)

## 2019-09-21 LAB — LIPID PANEL
Cholesterol: 142 mg/dL (ref 0–200)
HDL: 48.4 mg/dL (ref >39.00)
LDL Cholesterol: 70 mg/dL (ref 0–99)
NonHDL: 93.19
Total CHOL/HDL Ratio: 3
Triglycerides: 116 mg/dL (ref 0.0–149.0)
VLDL: 23.2 mg/dL (ref 0.0–40.0)

## 2019-09-21 MED ORDER — LOSARTAN POTASSIUM 100 MG PO TABS: 100.0000 mg | ORAL_TABLET | Freq: Every day | ORAL | 3 refills | Status: DC

## 2019-09-21 NOTE — Assessment & Plan Note (Signed)
Pt unable to exercise due to knee pain ---that has improved since injections Dash diet

## 2019-09-21 NOTE — Assessment & Plan Note (Signed)
Poorly controlled will alter medications, encouraged DASH diet, minimize caffeine and obtain adequate sleep. Report concerning symptoms and follow up as directed and as needed 

## 2019-09-21 NOTE — Assessment & Plan Note (Signed)
Stable con't all meds

## 2019-09-21 NOTE — Patient Instructions (Signed)
DASH Eating Plan DASH stands for "Dietary Approaches to Stop Hypertension." The DASH eating plan is a healthy eating plan that has been shown to reduce high blood pressure (hypertension). It may also reduce your risk for type 2 diabetes, heart disease, and stroke. The DASH eating plan may also help with weight loss. What are tips for following this plan?  General guidelines  Avoid eating more than 2,300 mg (milligrams) of salt (sodium) a day. If you have hypertension, you may need to reduce your sodium intake to 1,500 mg a day.  Limit alcohol intake to no more than 1 drink a day for nonpregnant women and 2 drinks a day for men. One drink equals 12 oz of beer, 5 oz of wine, or 1 oz of hard liquor.  Work with your health care provider to maintain a healthy body weight or to lose weight. Ask what an ideal weight is for you.  Get at least 30 minutes of exercise that causes your heart to beat faster (aerobic exercise) most days of the week. Activities may include walking, swimming, or biking.  Work with your health care provider or diet and nutrition specialist (dietitian) to adjust your eating plan to your individual calorie needs. Reading food labels   Check food labels for the amount of sodium per serving. Choose foods with less than 5 percent of the Daily Value of sodium. Generally, foods with less than 300 mg of sodium per serving fit into this eating plan.  To find whole grains, look for the word "whole" as the first word in the ingredient list. Shopping  Buy products labeled as "low-sodium" or "no salt added."  Buy fresh foods. Avoid canned foods and premade or frozen meals. Cooking  Avoid adding salt when cooking. Use salt-free seasonings or herbs instead of table salt or sea salt. Check with your health care provider or pharmacist before using salt substitutes.  Do not fry foods. Cook foods using healthy methods such as baking, boiling, grilling, and broiling instead.  Cook with  heart-healthy oils, such as olive, canola, soybean, or sunflower oil. Meal planning  Eat a balanced diet that includes: ? 5 or more servings of fruits and vegetables each day. At each meal, try to fill half of your plate with fruits and vegetables. ? Up to 6-8 servings of whole grains each day. ? Less than 6 oz of lean meat, poultry, or fish each day. A 3-oz serving of meat is about the same size as a deck of cards. One egg equals 1 oz. ? 2 servings of low-fat dairy each day. ? A serving of nuts, seeds, or beans 5 times each week. ? Heart-healthy fats. Healthy fats called Omega-3 fatty acids are found in foods such as flaxseeds and coldwater fish, like sardines, salmon, and mackerel.  Limit how much you eat of the following: ? Canned or prepackaged foods. ? Food that is high in trans fat, such as fried foods. ? Food that is high in saturated fat, such as fatty meat. ? Sweets, desserts, sugary drinks, and other foods with added sugar. ? Full-fat dairy products.  Do not salt foods before eating.  Try to eat at least 2 vegetarian meals each week.  Eat more home-cooked food and less restaurant, buffet, and fast food.  When eating at a restaurant, ask that your food be prepared with less salt or no salt, if possible. What foods are recommended? The items listed may not be a complete list. Talk with your dietitian about   what dietary choices are best for you. Grains Whole-grain or whole-wheat bread. Whole-grain or whole-wheat pasta. Brown rice. Oatmeal. Quinoa. Bulgur. Whole-grain and low-sodium cereals. Pita bread. Low-fat, low-sodium crackers. Whole-wheat flour tortillas. Vegetables Fresh or frozen vegetables (raw, steamed, roasted, or grilled). Low-sodium or reduced-sodium tomato and vegetable juice. Low-sodium or reduced-sodium tomato sauce and tomato paste. Low-sodium or reduced-sodium canned vegetables. Fruits All fresh, dried, or frozen fruit. Canned fruit in natural juice (without  added sugar). Meat and other protein foods Skinless chicken or turkey. Ground chicken or turkey. Pork with fat trimmed off. Fish and seafood. Egg whites. Dried beans, peas, or lentils. Unsalted nuts, nut butters, and seeds. Unsalted canned beans. Lean cuts of beef with fat trimmed off. Low-sodium, lean deli meat. Dairy Low-fat (1%) or fat-free (skim) milk. Fat-free, low-fat, or reduced-fat cheeses. Nonfat, low-sodium ricotta or cottage cheese. Low-fat or nonfat yogurt. Low-fat, low-sodium cheese. Fats and oils Soft margarine without trans fats. Vegetable oil. Low-fat, reduced-fat, or light mayonnaise and salad dressings (reduced-sodium). Canola, safflower, olive, soybean, and sunflower oils. Avocado. Seasoning and other foods Herbs. Spices. Seasoning mixes without salt. Unsalted popcorn and pretzels. Fat-free sweets. What foods are not recommended? The items listed may not be a complete list. Talk with your dietitian about what dietary choices are best for you. Grains Baked goods made with fat, such as croissants, muffins, or some breads. Dry pasta or rice meal packs. Vegetables Creamed or fried vegetables. Vegetables in a cheese sauce. Regular canned vegetables (not low-sodium or reduced-sodium). Regular canned tomato sauce and paste (not low-sodium or reduced-sodium). Regular tomato and vegetable juice (not low-sodium or reduced-sodium). Pickles. Olives. Fruits Canned fruit in a light or heavy syrup. Fried fruit. Fruit in cream or butter sauce. Meat and other protein foods Fatty cuts of meat. Ribs. Fried meat. Bacon. Sausage. Bologna and other processed lunch meats. Salami. Fatback. Hotdogs. Bratwurst. Salted nuts and seeds. Canned beans with added salt. Canned or smoked fish. Whole eggs or egg yolks. Chicken or turkey with skin. Dairy Whole or 2% milk, cream, and half-and-half. Whole or full-fat cream cheese. Whole-fat or sweetened yogurt. Full-fat cheese. Nondairy creamers. Whipped toppings.  Processed cheese and cheese spreads. Fats and oils Butter. Stick margarine. Lard. Shortening. Ghee. Bacon fat. Tropical oils, such as coconut, palm kernel, or palm oil. Seasoning and other foods Salted popcorn and pretzels. Onion salt, garlic salt, seasoned salt, table salt, and sea salt. Worcestershire sauce. Tartar sauce. Barbecue sauce. Teriyaki sauce. Soy sauce, including reduced-sodium. Steak sauce. Canned and packaged gravies. Fish sauce. Oyster sauce. Cocktail sauce. Horseradish that you find on the shelf. Ketchup. Mustard. Meat flavorings and tenderizers. Bouillon cubes. Hot sauce and Tabasco sauce. Premade or packaged marinades. Premade or packaged taco seasonings. Relishes. Regular salad dressings. Where to find more information:  National Heart, Lung, and Blood Institute: www.nhlbi.nih.gov  American Heart Association: www.heart.org Summary  The DASH eating plan is a healthy eating plan that has been shown to reduce high blood pressure (hypertension). It may also reduce your risk for type 2 diabetes, heart disease, and stroke.  With the DASH eating plan, you should limit salt (sodium) intake to 2,300 mg a day. If you have hypertension, you may need to reduce your sodium intake to 1,500 mg a day.  When on the DASH eating plan, aim to eat more fresh fruits and vegetables, whole grains, lean proteins, low-fat dairy, and heart-healthy fats.  Work with your health care provider or diet and nutrition specialist (dietitian) to adjust your eating plan to your   individual calorie needs. This information is not intended to replace advice given to you by your health care provider. Make sure you discuss any questions you have with your health care provider. Document Revised: 05/21/2017 Document Reviewed: 06/01/2016 Elsevier Patient Education  2020 Elsevier Inc.  

## 2019-09-21 NOTE — Progress Notes (Signed)
Patient ID: Kirsten Nelson, female    DOB: 01/29/1961  Age: 59 y.o. MRN: [id number]    Subjective:  Subjective  HPI Kirsten Nelson presents for f/u bp.  No complaints except she had her 2nd pfizer vaccine yesterday and she does not feel well today  Review of Systems  Constitutional: Negative for appetite change, diaphoresis, fatigue and unexpected weight change.  Eyes: Negative for pain, redness and visual disturbance.  Respiratory: Negative for cough, chest tightness, shortness of breath and wheezing.   Cardiovascular: Negative for chest pain, palpitations and leg swelling.  Endocrine: Negative for cold intolerance, heat intolerance, polydipsia, polyphagia and polyuria.  Genitourinary: Negative for difficulty urinating, dysuria and frequency.  Musculoskeletal: Positive for arthralgias.  Neurological: Negative for dizziness, light-headedness, numbness and headaches.    History Past Medical History:  Diagnosis Date  . Allergy   . Environmental allergies   . HTN (hypertension)   . Joint pain   . Obesity     She has a past surgical history that includes Tonsillectomy.   Her family history includes Cancer in her father and mother; Hypertension in her father and mother; Leukemia in her father; Lymphoma in her father.She reports that she has never smoked. She has never used smokeless tobacco. She reports that she does not drink alcohol or use drugs.  Current Outpatient Medications on File Prior to Visit  Medication Sig Dispense Refill  . BREO ELLIPTA 100-25 MCG/INH AEPB Take 1 puff by mouth daily.  2  . levocetirizine (XYZAL) 5 MG tablet Take 5 mg by mouth daily as needed.     . meloxicam (MOBIC) 15 MG tablet Take 1 tablet (15 mg total) by mouth daily as needed for pain. 14 tablet 0  . Montelukast Sodium (SINGULAIR PO) Take 10 mg by mouth daily as needed.     . Vitamin D, Ergocalciferol, (DRISDOL) 1.25 MG (50000 UNIT) CAPS capsule TAKE 1 CAPSULE (50,000 UNITS TOTAL) BY MOUTH EVERY 7  (SEVEN) DAYS. 4 capsule 3  . fluticasone (FLONASE) 50 MCG/ACT nasal spray Place 2 sprays into both nostrils daily. (Patient not taking: Reported on 07/17/2019) 16 g 6  . meclizine (ANTIVERT) 25 MG tablet Take 1 tablet (25 mg total) by mouth 3 (three) times daily as needed for dizziness. (Patient not taking: Reported on 07/17/2019) 30 tablet 0   No current facility-administered medications on file prior to visit.     Objective:  Objective  Physical Exam Vitals and nursing note reviewed.  Constitutional:      Appearance: She is well-developed.  HENT:     Head: Normocephalic and atraumatic.  Eyes:     Conjunctiva/sclera: Conjunctivae normal.  Neck:     Thyroid: No thyromegaly.     Vascular: No carotid bruit or JVD.  Cardiovascular:     Rate and Rhythm: Normal rate and regular rhythm.     Heart sounds: Normal heart sounds. No murmur.  Pulmonary:     Effort: Pulmonary effort is normal. No respiratory distress.     Breath sounds: Normal breath sounds. No wheezing or rales.  Chest:     Chest wall: No tenderness.  Musculoskeletal:     Cervical back: Normal range of motion and neck supple.  Neurological:     Mental Status: She is alert and oriented to person, place, and time.    BP (!) 154/100 (BP Location: Right Arm, Patient Position: Sitting, Cuff Size: Large)   Pulse 84   Temp 97.8 F (36.6 C) (Temporal)   Resp 20  Ht 5\' 5"  (1.651 m)   Wt 296 lb 9.6 oz (134.5 kg)   SpO2 93%   BMI 49.36 kg/m  Wt Readings from Last 3 Encounters:  09/21/19 296 lb 9.6 oz (134.5 kg)  07/17/19 294 lb (133.4 kg)  05/26/19 287 lb (130.2 kg)     Lab Results  Component Value Date   WBC 8.2 05/26/2019   HGB 15.0 05/26/2019   HCT 45.7 05/26/2019   PLT 275.0 05/26/2019   GLUCOSE 104 (H) 05/26/2019   CHOL 146 03/30/2019   TRIG 133.0 03/30/2019   HDL 50.30 03/30/2019   LDLCALC 70 03/30/2019   ALT 13 05/26/2019   AST 14 05/26/2019   NA 141 05/26/2019   K 4.3 05/26/2019   CL 104 05/26/2019     CREATININE 0.93 05/26/2019   BUN 14 05/26/2019   CO2 28 05/26/2019   TSH 1.19 05/26/2019   HGBA1C 5.7 (H) 08/04/2018    DG Wrist Complete Left  Result Date: 07/17/2019 CLINICAL DATA:  Posterior wrist pain for the past 3 weeks. No known injury. EXAM: LEFT WRIST - COMPLETE 3+ VIEW COMPARISON:  None. FINDINGS: No fracture or dislocation. Joint spaces are preserved. No erosions. No evidence of chondrocalcinosis. Regional soft tissues appear normal. IMPRESSION: No explanation for patient's posterior wrist pain. Electronically Signed   By: Sandi Mariscal M.D.   On: 07/17/2019 14:17     Assessment & Plan:  Plan  I have discontinued Shalini L. Heidinger's losartan. I am also having her start on losartan. Additionally, I am having her maintain her levocetirizine, Montelukast Sodium (SINGULAIR PO), Breo Ellipta, meclizine, fluticasone, meloxicam, and Vitamin D (Ergocalciferol).  Meds ordered this encounter  Medications  . losartan (COZAAR) 100 MG tablet    Sig: Take 1 tablet (100 mg total) by mouth daily.    Dispense:  90 tablet    Refill:  3    Problem List Items Addressed This Visit      Unprioritized   Class 3 severe obesity with serious comorbidity and body mass index (BMI) of 45.0 to 49.9 in adult ([place])    Pt unable to exercise due to knee pain ---that has improved since injections Dash diet        Environmental allergies    Stable con't all meds       Essential hypertension    Poorly controlled will alter medications, encouraged DASH diet, minimize caffeine and obtain adequate sleep. Report concerning symptoms and follow up as directed and as needed      Relevant Medications   losartan (COZAAR) 100 MG tablet   Other Relevant Orders   Lipid panel   Comprehensive metabolic panel      Follow-up: Return in about 6 months (around 03/22/2020), or if symptoms worsen or fail to improve, for annual exam, fasting.  Ann Held, DO

## 2019-11-08 ENCOUNTER — Other Ambulatory Visit: Payer: Self-pay | Admitting: Family Medicine

## 2019-11-08 DIAGNOSIS — E559 Vitamin D deficiency, unspecified: Secondary | ICD-10-CM

## 2019-11-08 NOTE — Telephone Encounter (Signed)
Dr Carollee Herter -- Please advise Vit D 50,000 units once weekly request.  Last vit D level 08/04/18. Pt is due for f/u with you on 03/22/20 but not yet scheduled.

## 2019-11-08 NOTE — Telephone Encounter (Signed)
Ok to refill x1 but pt needs ov

## 2020-01-24 ENCOUNTER — Encounter: Payer: Self-pay | Admitting: Family Medicine

## 2020-01-24 ENCOUNTER — Other Ambulatory Visit: Payer: Self-pay

## 2020-01-24 ENCOUNTER — Telehealth (INDEPENDENT_AMBULATORY_CARE_PROVIDER_SITE_OTHER): Payer: 59 | Admitting: Family Medicine

## 2020-01-24 DIAGNOSIS — R05 Cough: Secondary | ICD-10-CM | POA: Diagnosis not present

## 2020-01-24 DIAGNOSIS — R059 Cough, unspecified: Secondary | ICD-10-CM

## 2020-01-24 MED ORDER — AZITHROMYCIN 250 MG PO TABS: ORAL_TABLET | ORAL | 0 refills | Status: DC

## 2020-01-24 MED ORDER — PROMETHAZINE-DM 6.25-15 MG/5ML PO SYRP: 5.0000 mL | ORAL_SOLUTION | Freq: Four times a day (QID) | ORAL | 0 refills | Status: DC | PRN

## 2020-01-24 NOTE — Progress Notes (Signed)
Virtual Visit via Video Note  I connected with Kirsten Nelson on 01/24/20 at  9:00 AM EDT by a video enabled telemedicine application and verified that I am speaking with the correct person using two identifiers.  Location: Patient: home alone  Provider: home    I discussed the limitations of evaluation and management by telemedicine and the availability of in person appointments. The patient expressed understanding and agreed to proceed.  History of Present Illness: Pt is home c/o scratchy throat since Friday.  She went to a restaurant to celebrate her sisters birthday and then started with cough and sneezing.   She took mucinex -- she is still coughing    no nasal congestion    She has used mucinex with little relief  She did get the covid vaccine  Observations/Objective: There were no vitals filed for this visit. No fever per pt  Pt is in nad    Assessment and Plan: 1. Cough Pt to get covid vaccine  abx , cough meds per orders Call back if symptoms do not improve  - azithromycin (ZITHROMAX Z-PAK) 250 MG tablet; As directed  Dispense: 6 each; Refill: 0 - promethazine-dextromethorphan (PROMETHAZINE-DM) 6.25-15 MG/5ML syrup; Take 5 mLs by mouth 4 (four) times daily as needed.  Dispense: 118 mL; Refill: 0   Follow Up Instructions:    I discussed the assessment and treatment plan with the patient. The patient was provided an opportunity to ask questions and all were answered. The patient agreed with the plan and demonstrated an understanding of the instructions.   The patient was advised to call back or seek an in-person evaluation if the symptoms worsen or if the condition fails to improve as anticipated.  I provided 25 minutes of non-face-to-face time during this encounter.   Ann Held, DO

## 2020-01-29 ENCOUNTER — Telehealth: Payer: Self-pay | Admitting: Family Medicine

## 2020-01-29 NOTE — Telephone Encounter (Signed)
Ok to give note 

## 2020-01-29 NOTE — Telephone Encounter (Signed)
Please advise. Pt seen on virtually on 01/24/20

## 2020-01-29 NOTE — Telephone Encounter (Signed)
Caller: Sonja Call back # [phone]  Patient need a doctor note for 01/24/20 to be fax to [phone] attention Ree Edman.

## 2020-01-30 NOTE — Telephone Encounter (Signed)
Work note faxed

## 2020-02-25 ENCOUNTER — Other Ambulatory Visit: Payer: Self-pay | Admitting: Family Medicine

## 2020-02-25 DIAGNOSIS — E559 Vitamin D deficiency, unspecified: Secondary | ICD-10-CM

## 2020-06-19 ENCOUNTER — Other Ambulatory Visit: Payer: Self-pay | Admitting: Family Medicine

## 2020-06-19 DIAGNOSIS — E559 Vitamin D deficiency, unspecified: Secondary | ICD-10-CM

## 2020-06-19 NOTE — Telephone Encounter (Signed)
Vitamin d last checked by [facility] on 08/04/18 and was 29.5.  Do you want her to continue or get her in for recheck?

## 2020-07-09 ENCOUNTER — Telehealth (INDEPENDENT_AMBULATORY_CARE_PROVIDER_SITE_OTHER): Payer: 59 | Admitting: Family

## 2020-07-09 DIAGNOSIS — R059 Cough, unspecified: Secondary | ICD-10-CM

## 2020-07-09 DIAGNOSIS — J069 Acute upper respiratory infection, unspecified: Secondary | ICD-10-CM | POA: Diagnosis not present

## 2020-07-09 MED ORDER — PROMETHAZINE-DM 6.25-15 MG/5ML PO SYRP: 5.0000 mL | ORAL_SOLUTION | Freq: Four times a day (QID) | ORAL | 0 refills | Status: DC | PRN

## 2020-07-09 NOTE — Progress Notes (Signed)
Virtual Visit via Video Note  I connected with Kirsten Nelson on 07/09/20 at  1:40 PM EST by a video enabled telemedicine application and verified that I am speaking with the correct person using two identifiers.  Location: Patient: home Provider: home   I discussed the limitations of evaluation and management by telemedicine and the availability of in person appointments. The patient expressed understanding and agreed to proceed. Only the patient and myself were present for today's video call.   History of Present Illness:    Patient reports cough began Sunday evening. Monday she developed headache.  Had cough syrup on hand which she is using and is finding helpful. She would like to request a refill. Notes some nasal congestion. She denies associated fever,  loss of taste/smell, SOB, known covid exposures or body aches.    Observations/Objective:   Gen: Awake, alert, no acute distress Resp: Breathing is even and non-labored Psych: calm/pleasant demeanor Neuro: Alert and Oriented x 3, + facial symmetry, speech is clear.   Assessment and Plan:  Viral URI with cough- recommended that she continue supportive measures- rest/prn tylenol/fluids, promethazine cough syrup 90ml PO QID prn.  Recommended that she call if symptoms worsen, or if not improved in 3-4 days. Go to the ER if she develops SOB.  I also recommended that she be tested for covid-19 and gave her the number for [facility] to arrange testing: [phone].  Pt verbalizes understanding and is agreeable to plan.       Follow Up Instructions:    I discussed the assessment and treatment plan with the patient. The patient was provided an opportunity to ask questions and all were answered. The patient agreed with the plan and demonstrated an understanding of the instructions.   The patient was advised to call back or seek an in-person evaluation if the symptoms worsen or if the condition fails to improve as  anticipated.  Nance Pear, NP

## 2020-07-11 ENCOUNTER — Telehealth: Payer: Self-pay | Admitting: Family Medicine

## 2020-07-11 MED ORDER — AZITHROMYCIN 250 MG PO TABS
ORAL_TABLET | ORAL | 0 refills | Status: DC
Start: 2020-07-11 — End: 2020-10-14

## 2020-07-11 NOTE — Telephone Encounter (Signed)
Please advise 

## 2020-07-11 NOTE — Telephone Encounter (Signed)
Patient would a Z-PACK  Sent to the pharmacy

## 2020-07-11 NOTE — Telephone Encounter (Signed)
Letter printed out and faxed to number provided.

## 2020-07-11 NOTE — Telephone Encounter (Signed)
Patient states she was seen virtually by Debbrah Alar for [place]. Patient is requesting a Z pack be sent to local pharmacy.  Please advise

## 2020-07-11 NOTE — Telephone Encounter (Signed)
Cough is productive, requesting zpack. Rx sent, pt advised to follow back up with [name] if symptoms are not improved in 3 days.    She has missed yesterday, today, and tomorrow from work. Plans to return tomorrow.  Rod Holler, could you please fax work note that I wrote to her employer:   Kirsten Nelson Fax # [phone]

## 2020-07-24 ENCOUNTER — Telehealth: Payer: Self-pay | Admitting: Family Medicine

## 2020-07-24 DIAGNOSIS — R059 Cough, unspecified: Secondary | ICD-10-CM

## 2020-07-24 NOTE — Telephone Encounter (Signed)
Pt had a virtual on 07/09/2020 with Melissa and would like a refill for Promethazine

## 2020-07-24 NOTE — Telephone Encounter (Signed)
Ok to refill but if cough cont she needs ov

## 2020-07-24 NOTE — Telephone Encounter (Signed)
Medication:  promethazine-dextromethorphan (PROMETHAZINE-DM) 6.25-15 MG/5ML syrup [[id number]]      Has the patient contacted their pharmacy?  (If no, request that the patient contact the pharmacy for the refill.) (If yes, when and what did the pharmacy advise?)     Preferred Pharmacy (with phone number or street name): [id number] [address], [place] [address]  [place], [place] [address]  Phone:  [phone] Fax:  [phone]      Agent: Please be advised that RX refills may take up to 3 business days. We ask that you follow-up with your pharmacy.

## 2020-07-25 MED ORDER — PROMETHAZINE-DM 6.25-15 MG/5ML PO SYRP: 5.0000 mL | ORAL_SOLUTION | Freq: Four times a day (QID) | ORAL | 0 refills | Status: AC | PRN

## 2020-07-25 NOTE — Telephone Encounter (Signed)
Refill sent.

## 2020-10-12 ENCOUNTER — Other Ambulatory Visit: Payer: Self-pay | Admitting: Family Medicine

## 2020-10-12 DIAGNOSIS — E559 Vitamin D deficiency, unspecified: Secondary | ICD-10-CM

## 2020-10-14 ENCOUNTER — Other Ambulatory Visit: Payer: Self-pay

## 2020-10-14 ENCOUNTER — Encounter: Payer: Self-pay | Admitting: Internal Medicine

## 2020-10-14 ENCOUNTER — Telehealth (INDEPENDENT_AMBULATORY_CARE_PROVIDER_SITE_OTHER): Payer: 59 | Admitting: Internal Medicine

## 2020-10-14 VITALS — Ht 65.0 in | Wt 285.0 lb

## 2020-10-14 DIAGNOSIS — I10 Essential (primary) hypertension: Secondary | ICD-10-CM | POA: Diagnosis not present

## 2020-10-14 DIAGNOSIS — K529 Noninfective gastroenteritis and colitis, unspecified: Secondary | ICD-10-CM | POA: Diagnosis not present

## 2020-10-14 NOTE — Progress Notes (Signed)
Subjective:    Patient ID: Kirsten Nelson, female    DOB: 08/07/1960, 60 y.o.   MRN: [id number]  DOS:  10/14/2020 Type of visit - description: Virtual Visit via Telephone    I connected with above mentioned patient  by telephone and verified that I am speaking with the correct person using two identifiers.  THIS ENCOUNTER IS A VIRTUAL VISIT DUE TO COVID-19 - PATIENT WAS NOT SEEN IN THE OFFICE. PATIENT HAS CONSENTED TO VIRTUAL VISIT / TELEMEDICINE VISIT   Location of patient: home  Location of provider: office  Persons participating in the virtual visit: patient, provider   I discussed the limitations, risks, security and privacy concerns of performing an evaluation and management service by telephone and the availability of in person appointments. I also discussed with the patient that there may be a patient responsible charge related to this service. The patient expressed understanding and agreed to proceed.  Acute 3 days ago, she was at work, developed some nausea and later on that day chills and subjective fever. No abdominal pain, no vomiting. She went to sleep few hours later developed very loose/ runny stools.  No blood in the stools. 2 days ago had back pain, resolved after she took Advil and it has not come back. Other than feeling somewhat tired right now denies other symptoms.  No dysuria gross hematuria No rash anywhere No constipation, BMs are normal No cough or any other respiratory symptoms       Review of Systems See above   Past Medical History:  Diagnosis Date  . Allergy   . Environmental allergies   . HTN (hypertension)   . Joint pain   . Obesity     Past Surgical History:  Procedure Laterality Date  . TONSILLECTOMY      Allergies as of 10/14/2020      Reactions   Ace Inhibitors Cough      Medication List       Accurate as of October 14, 2020  3:43 PM. If you have any questions, ask your nurse or doctor.        azithromycin 250 MG  tablet Commonly known as: ZITHROMAX Take 2 tabs by mouth today, then one tab once daily for 4 more days.   Breo Ellipta 100-25 MCG/INH Aepb Generic drug: fluticasone furoate-vilanterol Take 1 puff by mouth daily.   levocetirizine 5 MG tablet Commonly known as: XYZAL Take 5 mg by mouth daily as needed.   losartan 100 MG tablet Commonly known as: COZAAR Take 1 tablet (100 mg total) by mouth daily.   meclizine 25 MG tablet Commonly known as: ANTIVERT Take 1 tablet (25 mg total) by mouth 3 (three) times daily as needed for dizziness.   meloxicam 15 MG tablet Commonly known as: MOBIC Take 1 tablet (15 mg total) by mouth daily as needed for pain.   promethazine-dextromethorphan 6.25-15 MG/5ML syrup Commonly known as: PROMETHAZINE-DM Take 5 mLs by mouth 4 (four) times daily as needed. If symptoms continue. Pt will need visit.   SINGULAIR PO Take 10 mg by mouth daily as needed.   Vitamin D (Ergocalciferol) 1.25 MG (50000 UNIT) Caps capsule Commonly known as: DRISDOL TAKE 1 CAPSULE (50,000 UNITS TOTAL) BY MOUTH EVERY 7 (SEVEN) DAYS.          Objective:   Physical Exam Ht 5\' 5"  (1.651 m)   Wt 285 lb (129.3 kg)   BMI 47.43 kg/m  This is a telephone visit, she sounded well, alert  oriented x3, in no distress.  No vital signs available today.    Assessment    Gastroenteritis: Developed nausea, subjective fever and diarrhea. Symptoms are much better now, suspect self-limited illness, could be food related or viral related (I have seen some other patients in the community with similar symptoms). Recommend rest, drink plenty fluids, call if no better, call if severe symptoms. My suspicion for this to be COVID-related is very low.  She could get at home test for COVID if symptoms persist. HTN: Good med compliance, no recent ambulatory BPs, recommend to check.   I discussed the assessment and treatment plan with the patient. The patient was provided an opportunity to ask  questions and all were answered. The patient agreed with the plan and demonstrated an understanding of the instructions.   The patient was advised to call back or seek an in-person evaluation if the symptoms worsen or if the condition fails to improve as anticipated.  I provided 15 minutes of non-face-to-face time during this encounter.  Kathlene November, MD

## 2020-10-16 ENCOUNTER — Other Ambulatory Visit: Payer: Self-pay | Admitting: Family Medicine

## 2020-10-16 DIAGNOSIS — I10 Essential (primary) hypertension: Secondary | ICD-10-CM

## 2020-10-17 ENCOUNTER — Encounter: Payer: Self-pay | Admitting: Gastroenterology

## 2020-11-01 ENCOUNTER — Other Ambulatory Visit: Payer: Self-pay | Admitting: Family Medicine

## 2020-11-01 DIAGNOSIS — E559 Vitamin D deficiency, unspecified: Secondary | ICD-10-CM

## 2021-10-27 ENCOUNTER — Other Ambulatory Visit: Payer: Self-pay | Admitting: Family Medicine

## 2021-10-27 DIAGNOSIS — I10 Essential (primary) hypertension: Secondary | ICD-10-CM

## 2021-11-24 ENCOUNTER — Other Ambulatory Visit: Payer: Self-pay | Admitting: Family Medicine

## 2021-11-24 DIAGNOSIS — I10 Essential (primary) hypertension: Secondary | ICD-10-CM

## 2021-12-30 ENCOUNTER — Other Ambulatory Visit: Payer: Self-pay | Admitting: Family Medicine

## 2021-12-30 DIAGNOSIS — I10 Essential (primary) hypertension: Secondary | ICD-10-CM

## 2022-01-24 ENCOUNTER — Other Ambulatory Visit: Payer: Self-pay | Admitting: Family Medicine

## 2022-01-24 DIAGNOSIS — I10 Essential (primary) hypertension: Secondary | ICD-10-CM

## 2022-05-22 DEATH — deceased

## 2023-06-15 ENCOUNTER — Telehealth: Payer: Self-pay | Admitting: Family Medicine

## 2023-06-15 NOTE — Telephone Encounter (Signed)
Vm not set up, due for yearly exam.
